# Patient Record
Sex: Female | Born: 1970 | Race: White | Hispanic: No | Marital: Married | Smoking: Never smoker
Health system: Southern US, Community
[De-identification: ages and names within clinical notes are randomized; demographics above are authoritative.]

---

## 2011-02-26 ENCOUNTER — Ambulatory Visit
Admission: RE | Admit: 2011-02-26 | Discharge: 2011-02-26 | Disposition: A | Discharge status: Home / Self Care | Payer: 59 | Source: Ambulatory Visit | Attending: Obstetrics and Gynecology | Admitting: Obstetrics and Gynecology

## 2011-02-26 ENCOUNTER — Other Ambulatory Visit: Payer: Self-pay | Admitting: Obstetrics and Gynecology

## 2011-02-26 DIAGNOSIS — R928 Other abnormal and inconclusive findings on diagnostic imaging of breast: Secondary | ICD-10-CM

## 2011-02-27 ENCOUNTER — Other Ambulatory Visit: Payer: Self-pay

## 2012-04-18 ENCOUNTER — Other Ambulatory Visit: Payer: Self-pay | Admitting: Obstetrics and Gynecology

## 2012-04-18 DIAGNOSIS — Z1231 Encounter for screening mammogram for malignant neoplasm of breast: Secondary | ICD-10-CM

## 2012-05-15 ENCOUNTER — Ambulatory Visit
Admission: RE | Admit: 2012-05-15 | Discharge: 2012-05-15 | Disposition: A | Discharge status: Home / Self Care | Payer: 59 | Source: Ambulatory Visit | Attending: Obstetrics and Gynecology | Admitting: Obstetrics and Gynecology

## 2012-05-15 DIAGNOSIS — Z1231 Encounter for screening mammogram for malignant neoplasm of breast: Secondary | ICD-10-CM

## 2012-05-16 ENCOUNTER — Other Ambulatory Visit: Payer: Self-pay | Admitting: Obstetrics and Gynecology

## 2012-05-16 DIAGNOSIS — R928 Other abnormal and inconclusive findings on diagnostic imaging of breast: Secondary | ICD-10-CM

## 2012-05-28 ENCOUNTER — Ambulatory Visit
Admission: RE | Admit: 2012-05-28 | Discharge: 2012-05-28 | Disposition: A | Discharge status: Home / Self Care | Payer: 59 | Source: Ambulatory Visit | Attending: Obstetrics and Gynecology | Admitting: Obstetrics and Gynecology

## 2012-05-28 DIAGNOSIS — R928 Other abnormal and inconclusive findings on diagnostic imaging of breast: Secondary | ICD-10-CM

## 2014-04-04 ENCOUNTER — Encounter (HOSPITAL_COMMUNITY): Payer: Self-pay

## 2014-04-04 ENCOUNTER — Emergency Department (HOSPITAL_COMMUNITY)
Admission: EM | Admit: 2014-04-04 | Discharge: 2014-04-04 | Disposition: A | Discharge status: Home / Self Care | Payer: 59 | Source: Home / Self Care | Attending: Family Medicine | Admitting: Family Medicine

## 2014-04-04 DIAGNOSIS — L738 Other specified follicular disorders: Secondary | ICD-10-CM

## 2014-04-04 DIAGNOSIS — L0102 Bockhart's impetigo: Secondary | ICD-10-CM

## 2014-04-04 MED ORDER — MINOCYCLINE HCL 100 MG PO CAPS: 100.0000 mg | ORAL_CAPSULE | Freq: Two times a day (BID) | ORAL | Status: AC

## 2014-04-04 NOTE — ED Notes (Signed)
Concerned about rash on scalp, ear, shoulder

## 2014-04-04 NOTE — ED Provider Notes (Signed)
CSN: [id number]     Arrival date & time 04/04/14  1046 History   First MD Initiated Contact with Patient 04/04/14 1120     Chief Complaint  Patient presents with  . Rash   (Consider location/radiation/quality/duration/timing/severity/associated sxs/prior Treatment) Patient is a 44 y.o. female presenting with rash. The history is provided by the patient.  Rash Location:  Head/neck Head/neck rash location:  Scalp Quality: draining, painful and redness   Pain details:    Severity:  Mild   Onset quality:  Gradual   Duration:  5 days   Progression:  Worsening Progression:  Spreading Chronicity:  New Context comment:  Uncertain etiol , pt exercises reg on equip, etc. Associated symptoms: no fever     History reviewed. No pertinent past medical history. History reviewed. No pertinent past surgical history. History reviewed. No pertinent family history. History  Substance Use Topics  . Smoking status: Never Smoker   . Smokeless tobacco: Not on file  . Alcohol Use: No   OB History    No data available     Review of Systems  Constitutional: Negative.  Negative for fever.  HENT: Negative.   Skin: Positive for rash.    Allergies  Levaquin and Penicillins  Home Medications   Prior to Admission medications   Medication Sig Start Date End Date Taking? Authorizing Provider  levonorgestrel-ethinyl estradiol (AVIANE,ALESSE,LESSINA) 0.1-20 MG-MCG tablet Take 1 tablet by mouth daily.   Yes Historical Provider, MD  minocycline (MINOCIN,DYNACIN) 100 MG capsule Take 1 capsule (100 mg total) by mouth 2 (two) times daily. 04/04/14   Linna HoffJames D Kindl, MD   BP 124/84 mmHg  Pulse 82  Temp(Src) 98.7 F (37.1 C) (Oral)  Resp 16  SpO2 100% Physical Exam  Constitutional: She is oriented to person, place, and time. She appears well-developed and well-nourished. She appears distressed.  HENT:  Right Ear: External ear normal.  Left Ear: External ear normal.  Mouth/Throat: Oropharynx is clear  and moist.  Eyes: Conjunctivae are normal. Pupils are equal, round, and reactive to light.  Neck: Normal range of motion. Neck supple.  Lymphadenopathy:    She has cervical adenopathy.  Neurological: She is alert and oriented to person, place, and time.  Skin: Skin is warm and dry.  Papulopustular weeping rash on scalp with nodes post.  Nursing note and vitals reviewed.   ED Course  Procedures (including critical care time) Labs Review Labs Reviewed - No data to display  Imaging Review No results found.   MDM   1. Pustular folliculitis       Linna HoffJames D Kindl, MD 04/04/14 [phone]

## 2014-04-04 NOTE — Discharge Instructions (Signed)
Wash scalp regularly, change brush. Take all of medicine.

## 2014-09-23 ENCOUNTER — Other Ambulatory Visit: Payer: Self-pay

## 2014-09-23 DIAGNOSIS — Z1231 Encounter for screening mammogram for malignant neoplasm of breast: Secondary | ICD-10-CM

## 2014-10-12 ENCOUNTER — Ambulatory Visit
Admission: RE | Admit: 2014-10-12 | Discharge: 2014-10-12 | Disposition: A | Discharge status: Home / Self Care | Payer: 59 | Source: Ambulatory Visit

## 2014-10-12 DIAGNOSIS — Z1231 Encounter for screening mammogram for malignant neoplasm of breast: Secondary | ICD-10-CM

## 2015-09-05 ENCOUNTER — Other Ambulatory Visit: Payer: Self-pay | Admitting: Obstetrics and Gynecology

## 2015-09-05 DIAGNOSIS — Z1231 Encounter for screening mammogram for malignant neoplasm of breast: Secondary | ICD-10-CM

## 2015-10-17 ENCOUNTER — Ambulatory Visit
Admission: RE | Admit: 2015-10-17 | Discharge: 2015-10-17 | Disposition: A | Discharge status: Home / Self Care | Payer: 59 | Source: Ambulatory Visit | Attending: Obstetrics and Gynecology | Admitting: Obstetrics and Gynecology

## 2015-10-17 DIAGNOSIS — Z1231 Encounter for screening mammogram for malignant neoplasm of breast: Secondary | ICD-10-CM

## 2016-02-09 ENCOUNTER — Other Ambulatory Visit: Payer: Self-pay | Admitting: Internal Medicine

## 2016-02-09 DIAGNOSIS — R339 Retention of urine, unspecified: Secondary | ICD-10-CM

## 2016-02-10 ENCOUNTER — Other Ambulatory Visit: Payer: 59

## 2016-02-17 ENCOUNTER — Other Ambulatory Visit: Payer: 59

## 2016-03-16 DIAGNOSIS — J3089 Other allergic rhinitis: Secondary | ICD-10-CM | POA: Diagnosis not present

## 2016-03-16 DIAGNOSIS — J301 Allergic rhinitis due to pollen: Secondary | ICD-10-CM | POA: Diagnosis not present

## 2016-03-16 DIAGNOSIS — J3081 Allergic rhinitis due to animal (cat) (dog) hair and dander: Secondary | ICD-10-CM | POA: Diagnosis not present

## 2016-03-22 DIAGNOSIS — J3089 Other allergic rhinitis: Secondary | ICD-10-CM | POA: Diagnosis not present

## 2016-03-22 DIAGNOSIS — J3081 Allergic rhinitis due to animal (cat) (dog) hair and dander: Secondary | ICD-10-CM | POA: Diagnosis not present

## 2016-03-22 DIAGNOSIS — J301 Allergic rhinitis due to pollen: Secondary | ICD-10-CM | POA: Diagnosis not present

## 2016-03-26 DIAGNOSIS — H1089 Other conjunctivitis: Secondary | ICD-10-CM | POA: Diagnosis not present

## 2016-03-26 DIAGNOSIS — H04123 Dry eye syndrome of bilateral lacrimal glands: Secondary | ICD-10-CM | POA: Diagnosis not present

## 2016-03-26 DIAGNOSIS — H02841 Edema of right upper eyelid: Secondary | ICD-10-CM | POA: Diagnosis not present

## 2016-03-30 DIAGNOSIS — J3089 Other allergic rhinitis: Secondary | ICD-10-CM | POA: Diagnosis not present

## 2016-03-30 DIAGNOSIS — J301 Allergic rhinitis due to pollen: Secondary | ICD-10-CM | POA: Diagnosis not present

## 2016-03-30 DIAGNOSIS — J3081 Allergic rhinitis due to animal (cat) (dog) hair and dander: Secondary | ICD-10-CM | POA: Diagnosis not present

## 2016-04-02 DIAGNOSIS — J301 Allergic rhinitis due to pollen: Secondary | ICD-10-CM | POA: Diagnosis not present

## 2016-04-03 DIAGNOSIS — J3089 Other allergic rhinitis: Secondary | ICD-10-CM | POA: Diagnosis not present

## 2016-04-03 DIAGNOSIS — J3081 Allergic rhinitis due to animal (cat) (dog) hair and dander: Secondary | ICD-10-CM | POA: Diagnosis not present

## 2016-04-12 DIAGNOSIS — J3089 Other allergic rhinitis: Secondary | ICD-10-CM | POA: Diagnosis not present

## 2016-04-12 DIAGNOSIS — J301 Allergic rhinitis due to pollen: Secondary | ICD-10-CM | POA: Diagnosis not present

## 2016-04-12 DIAGNOSIS — J3081 Allergic rhinitis due to animal (cat) (dog) hair and dander: Secondary | ICD-10-CM | POA: Diagnosis not present

## 2016-04-13 DIAGNOSIS — M75102 Unspecified rotator cuff tear or rupture of left shoulder, not specified as traumatic: Secondary | ICD-10-CM | POA: Diagnosis not present

## 2016-04-13 DIAGNOSIS — M542 Cervicalgia: Secondary | ICD-10-CM | POA: Diagnosis not present

## 2016-04-16 DIAGNOSIS — M7582 Other shoulder lesions, left shoulder: Secondary | ICD-10-CM | POA: Diagnosis not present

## 2016-04-16 DIAGNOSIS — M542 Cervicalgia: Secondary | ICD-10-CM | POA: Diagnosis not present

## 2016-04-19 DIAGNOSIS — M25512 Pain in left shoulder: Secondary | ICD-10-CM | POA: Diagnosis not present

## 2016-04-19 DIAGNOSIS — S46012D Strain of muscle(s) and tendon(s) of the rotator cuff of left shoulder, subsequent encounter: Secondary | ICD-10-CM | POA: Diagnosis not present

## 2016-04-19 DIAGNOSIS — M7502 Adhesive capsulitis of left shoulder: Secondary | ICD-10-CM | POA: Diagnosis not present

## 2016-04-24 DIAGNOSIS — M25512 Pain in left shoulder: Secondary | ICD-10-CM | POA: Diagnosis not present

## 2016-04-24 DIAGNOSIS — M7502 Adhesive capsulitis of left shoulder: Secondary | ICD-10-CM | POA: Diagnosis not present

## 2016-04-24 DIAGNOSIS — S46012D Strain of muscle(s) and tendon(s) of the rotator cuff of left shoulder, subsequent encounter: Secondary | ICD-10-CM | POA: Diagnosis not present

## 2016-04-26 DIAGNOSIS — M25512 Pain in left shoulder: Secondary | ICD-10-CM | POA: Diagnosis not present

## 2016-04-26 DIAGNOSIS — S46012D Strain of muscle(s) and tendon(s) of the rotator cuff of left shoulder, subsequent encounter: Secondary | ICD-10-CM | POA: Diagnosis not present

## 2016-04-26 DIAGNOSIS — M7502 Adhesive capsulitis of left shoulder: Secondary | ICD-10-CM | POA: Diagnosis not present

## 2016-04-27 DIAGNOSIS — J301 Allergic rhinitis due to pollen: Secondary | ICD-10-CM | POA: Diagnosis not present

## 2016-04-27 DIAGNOSIS — J3081 Allergic rhinitis due to animal (cat) (dog) hair and dander: Secondary | ICD-10-CM | POA: Diagnosis not present

## 2016-04-27 DIAGNOSIS — J3089 Other allergic rhinitis: Secondary | ICD-10-CM | POA: Diagnosis not present

## 2016-05-01 DIAGNOSIS — M7502 Adhesive capsulitis of left shoulder: Secondary | ICD-10-CM | POA: Diagnosis not present

## 2016-05-01 DIAGNOSIS — M25512 Pain in left shoulder: Secondary | ICD-10-CM | POA: Diagnosis not present

## 2016-05-01 DIAGNOSIS — S46012D Strain of muscle(s) and tendon(s) of the rotator cuff of left shoulder, subsequent encounter: Secondary | ICD-10-CM | POA: Diagnosis not present

## 2016-05-03 DIAGNOSIS — J3089 Other allergic rhinitis: Secondary | ICD-10-CM | POA: Diagnosis not present

## 2016-05-03 DIAGNOSIS — J3081 Allergic rhinitis due to animal (cat) (dog) hair and dander: Secondary | ICD-10-CM | POA: Diagnosis not present

## 2016-05-03 DIAGNOSIS — J301 Allergic rhinitis due to pollen: Secondary | ICD-10-CM | POA: Diagnosis not present

## 2016-05-04 DIAGNOSIS — M7502 Adhesive capsulitis of left shoulder: Secondary | ICD-10-CM | POA: Diagnosis not present

## 2016-05-04 DIAGNOSIS — S46012D Strain of muscle(s) and tendon(s) of the rotator cuff of left shoulder, subsequent encounter: Secondary | ICD-10-CM | POA: Diagnosis not present

## 2016-05-04 DIAGNOSIS — M25512 Pain in left shoulder: Secondary | ICD-10-CM | POA: Diagnosis not present

## 2016-05-09 DIAGNOSIS — M7502 Adhesive capsulitis of left shoulder: Secondary | ICD-10-CM | POA: Diagnosis not present

## 2016-05-09 DIAGNOSIS — M25512 Pain in left shoulder: Secondary | ICD-10-CM | POA: Diagnosis not present

## 2016-05-09 DIAGNOSIS — S46012D Strain of muscle(s) and tendon(s) of the rotator cuff of left shoulder, subsequent encounter: Secondary | ICD-10-CM | POA: Diagnosis not present

## 2016-05-11 DIAGNOSIS — S46012D Strain of muscle(s) and tendon(s) of the rotator cuff of left shoulder, subsequent encounter: Secondary | ICD-10-CM | POA: Diagnosis not present

## 2016-05-11 DIAGNOSIS — M25512 Pain in left shoulder: Secondary | ICD-10-CM | POA: Diagnosis not present

## 2016-05-11 DIAGNOSIS — M7502 Adhesive capsulitis of left shoulder: Secondary | ICD-10-CM | POA: Diagnosis not present

## 2016-05-14 DIAGNOSIS — J3081 Allergic rhinitis due to animal (cat) (dog) hair and dander: Secondary | ICD-10-CM | POA: Diagnosis not present

## 2016-05-14 DIAGNOSIS — J301 Allergic rhinitis due to pollen: Secondary | ICD-10-CM | POA: Diagnosis not present

## 2016-05-14 DIAGNOSIS — J3089 Other allergic rhinitis: Secondary | ICD-10-CM | POA: Diagnosis not present

## 2016-05-15 DIAGNOSIS — S46012D Strain of muscle(s) and tendon(s) of the rotator cuff of left shoulder, subsequent encounter: Secondary | ICD-10-CM | POA: Diagnosis not present

## 2016-05-15 DIAGNOSIS — M7502 Adhesive capsulitis of left shoulder: Secondary | ICD-10-CM | POA: Diagnosis not present

## 2016-05-15 DIAGNOSIS — M25512 Pain in left shoulder: Secondary | ICD-10-CM | POA: Diagnosis not present

## 2016-05-17 DIAGNOSIS — S46012D Strain of muscle(s) and tendon(s) of the rotator cuff of left shoulder, subsequent encounter: Secondary | ICD-10-CM | POA: Diagnosis not present

## 2016-05-17 DIAGNOSIS — M25512 Pain in left shoulder: Secondary | ICD-10-CM | POA: Diagnosis not present

## 2016-05-17 DIAGNOSIS — M7502 Adhesive capsulitis of left shoulder: Secondary | ICD-10-CM | POA: Diagnosis not present

## 2016-05-21 DIAGNOSIS — J3081 Allergic rhinitis due to animal (cat) (dog) hair and dander: Secondary | ICD-10-CM | POA: Diagnosis not present

## 2016-05-21 DIAGNOSIS — J3089 Other allergic rhinitis: Secondary | ICD-10-CM | POA: Diagnosis not present

## 2016-05-21 DIAGNOSIS — J301 Allergic rhinitis due to pollen: Secondary | ICD-10-CM | POA: Diagnosis not present

## 2016-05-22 DIAGNOSIS — S46012D Strain of muscle(s) and tendon(s) of the rotator cuff of left shoulder, subsequent encounter: Secondary | ICD-10-CM | POA: Diagnosis not present

## 2016-05-22 DIAGNOSIS — M7502 Adhesive capsulitis of left shoulder: Secondary | ICD-10-CM | POA: Diagnosis not present

## 2016-05-22 DIAGNOSIS — M25512 Pain in left shoulder: Secondary | ICD-10-CM | POA: Diagnosis not present

## 2016-05-25 DIAGNOSIS — S46012D Strain of muscle(s) and tendon(s) of the rotator cuff of left shoulder, subsequent encounter: Secondary | ICD-10-CM | POA: Diagnosis not present

## 2016-05-25 DIAGNOSIS — M25512 Pain in left shoulder: Secondary | ICD-10-CM | POA: Diagnosis not present

## 2016-05-25 DIAGNOSIS — M7502 Adhesive capsulitis of left shoulder: Secondary | ICD-10-CM | POA: Diagnosis not present

## 2016-05-29 DIAGNOSIS — M25512 Pain in left shoulder: Secondary | ICD-10-CM | POA: Diagnosis not present

## 2016-05-29 DIAGNOSIS — S46012D Strain of muscle(s) and tendon(s) of the rotator cuff of left shoulder, subsequent encounter: Secondary | ICD-10-CM | POA: Diagnosis not present

## 2016-05-29 DIAGNOSIS — M7502 Adhesive capsulitis of left shoulder: Secondary | ICD-10-CM | POA: Diagnosis not present

## 2016-05-31 DIAGNOSIS — J3089 Other allergic rhinitis: Secondary | ICD-10-CM | POA: Diagnosis not present

## 2016-05-31 DIAGNOSIS — J3081 Allergic rhinitis due to animal (cat) (dog) hair and dander: Secondary | ICD-10-CM | POA: Diagnosis not present

## 2016-05-31 DIAGNOSIS — J301 Allergic rhinitis due to pollen: Secondary | ICD-10-CM | POA: Diagnosis not present

## 2016-06-01 DIAGNOSIS — S46012D Strain of muscle(s) and tendon(s) of the rotator cuff of left shoulder, subsequent encounter: Secondary | ICD-10-CM | POA: Diagnosis not present

## 2016-06-01 DIAGNOSIS — M7502 Adhesive capsulitis of left shoulder: Secondary | ICD-10-CM | POA: Diagnosis not present

## 2016-06-01 DIAGNOSIS — M25512 Pain in left shoulder: Secondary | ICD-10-CM | POA: Diagnosis not present

## 2016-06-06 DIAGNOSIS — M25512 Pain in left shoulder: Secondary | ICD-10-CM | POA: Diagnosis not present

## 2016-06-06 DIAGNOSIS — S46012D Strain of muscle(s) and tendon(s) of the rotator cuff of left shoulder, subsequent encounter: Secondary | ICD-10-CM | POA: Diagnosis not present

## 2016-06-06 DIAGNOSIS — M7502 Adhesive capsulitis of left shoulder: Secondary | ICD-10-CM | POA: Diagnosis not present

## 2016-06-08 DIAGNOSIS — S46012D Strain of muscle(s) and tendon(s) of the rotator cuff of left shoulder, subsequent encounter: Secondary | ICD-10-CM | POA: Diagnosis not present

## 2016-06-08 DIAGNOSIS — M25512 Pain in left shoulder: Secondary | ICD-10-CM | POA: Diagnosis not present

## 2016-06-08 DIAGNOSIS — M7502 Adhesive capsulitis of left shoulder: Secondary | ICD-10-CM | POA: Diagnosis not present

## 2016-06-14 DIAGNOSIS — J3081 Allergic rhinitis due to animal (cat) (dog) hair and dander: Secondary | ICD-10-CM | POA: Diagnosis not present

## 2016-06-14 DIAGNOSIS — J301 Allergic rhinitis due to pollen: Secondary | ICD-10-CM | POA: Diagnosis not present

## 2016-06-14 DIAGNOSIS — J3089 Other allergic rhinitis: Secondary | ICD-10-CM | POA: Diagnosis not present

## 2016-06-29 DIAGNOSIS — J3089 Other allergic rhinitis: Secondary | ICD-10-CM | POA: Diagnosis not present

## 2016-06-29 DIAGNOSIS — J301 Allergic rhinitis due to pollen: Secondary | ICD-10-CM | POA: Diagnosis not present

## 2016-06-29 DIAGNOSIS — J3081 Allergic rhinitis due to animal (cat) (dog) hair and dander: Secondary | ICD-10-CM | POA: Diagnosis not present

## 2016-07-06 DIAGNOSIS — J301 Allergic rhinitis due to pollen: Secondary | ICD-10-CM | POA: Diagnosis not present

## 2016-07-06 DIAGNOSIS — J3089 Other allergic rhinitis: Secondary | ICD-10-CM | POA: Diagnosis not present

## 2016-07-06 DIAGNOSIS — J3081 Allergic rhinitis due to animal (cat) (dog) hair and dander: Secondary | ICD-10-CM | POA: Diagnosis not present

## 2016-07-13 DIAGNOSIS — J301 Allergic rhinitis due to pollen: Secondary | ICD-10-CM | POA: Diagnosis not present

## 2016-07-13 DIAGNOSIS — J3089 Other allergic rhinitis: Secondary | ICD-10-CM | POA: Diagnosis not present

## 2016-07-13 DIAGNOSIS — J3081 Allergic rhinitis due to animal (cat) (dog) hair and dander: Secondary | ICD-10-CM | POA: Diagnosis not present

## 2016-07-18 DIAGNOSIS — M25562 Pain in left knee: Secondary | ICD-10-CM | POA: Diagnosis not present

## 2016-07-26 DIAGNOSIS — J3089 Other allergic rhinitis: Secondary | ICD-10-CM | POA: Diagnosis not present

## 2016-07-26 DIAGNOSIS — J3081 Allergic rhinitis due to animal (cat) (dog) hair and dander: Secondary | ICD-10-CM | POA: Diagnosis not present

## 2016-07-26 DIAGNOSIS — J301 Allergic rhinitis due to pollen: Secondary | ICD-10-CM | POA: Diagnosis not present

## 2016-08-02 DIAGNOSIS — J3089 Other allergic rhinitis: Secondary | ICD-10-CM | POA: Diagnosis not present

## 2016-08-02 DIAGNOSIS — J3081 Allergic rhinitis due to animal (cat) (dog) hair and dander: Secondary | ICD-10-CM | POA: Diagnosis not present

## 2016-08-02 DIAGNOSIS — J301 Allergic rhinitis due to pollen: Secondary | ICD-10-CM | POA: Diagnosis not present

## 2016-08-15 ENCOUNTER — Other Ambulatory Visit: Payer: Self-pay | Admitting: Internal Medicine

## 2016-08-15 DIAGNOSIS — M79605 Pain in left leg: Secondary | ICD-10-CM | POA: Diagnosis not present

## 2016-08-15 DIAGNOSIS — Z0000 Encounter for general adult medical examination without abnormal findings: Secondary | ICD-10-CM | POA: Diagnosis not present

## 2016-08-16 ENCOUNTER — Ambulatory Visit
Admission: RE | Admit: 2016-08-16 | Discharge: 2016-08-16 | Disposition: A | Discharge status: Home / Self Care | Payer: 59 | Source: Ambulatory Visit | Attending: Internal Medicine | Admitting: Internal Medicine

## 2016-08-16 DIAGNOSIS — M79605 Pain in left leg: Secondary | ICD-10-CM | POA: Diagnosis not present

## 2016-08-17 DIAGNOSIS — J301 Allergic rhinitis due to pollen: Secondary | ICD-10-CM | POA: Diagnosis not present

## 2016-08-17 DIAGNOSIS — J3089 Other allergic rhinitis: Secondary | ICD-10-CM | POA: Diagnosis not present

## 2016-08-17 DIAGNOSIS — J3081 Allergic rhinitis due to animal (cat) (dog) hair and dander: Secondary | ICD-10-CM | POA: Diagnosis not present

## 2016-08-30 DIAGNOSIS — J3081 Allergic rhinitis due to animal (cat) (dog) hair and dander: Secondary | ICD-10-CM | POA: Diagnosis not present

## 2016-08-30 DIAGNOSIS — J301 Allergic rhinitis due to pollen: Secondary | ICD-10-CM | POA: Diagnosis not present

## 2016-08-30 DIAGNOSIS — J3089 Other allergic rhinitis: Secondary | ICD-10-CM | POA: Diagnosis not present

## 2016-08-31 DIAGNOSIS — M25562 Pain in left knee: Secondary | ICD-10-CM | POA: Diagnosis not present

## 2016-08-31 DIAGNOSIS — Z0000 Encounter for general adult medical examination without abnormal findings: Secondary | ICD-10-CM | POA: Diagnosis not present

## 2016-09-07 DIAGNOSIS — J301 Allergic rhinitis due to pollen: Secondary | ICD-10-CM | POA: Diagnosis not present

## 2016-09-07 DIAGNOSIS — J3089 Other allergic rhinitis: Secondary | ICD-10-CM | POA: Diagnosis not present

## 2016-09-07 DIAGNOSIS — J3081 Allergic rhinitis due to animal (cat) (dog) hair and dander: Secondary | ICD-10-CM | POA: Diagnosis not present

## 2016-09-11 ENCOUNTER — Other Ambulatory Visit: Payer: Self-pay | Admitting: Obstetrics and Gynecology

## 2016-09-11 DIAGNOSIS — Z1231 Encounter for screening mammogram for malignant neoplasm of breast: Secondary | ICD-10-CM

## 2016-09-13 DIAGNOSIS — J301 Allergic rhinitis due to pollen: Secondary | ICD-10-CM | POA: Diagnosis not present

## 2016-09-13 DIAGNOSIS — J3089 Other allergic rhinitis: Secondary | ICD-10-CM | POA: Diagnosis not present

## 2016-09-13 DIAGNOSIS — Z124 Encounter for screening for malignant neoplasm of cervix: Secondary | ICD-10-CM | POA: Diagnosis not present

## 2016-09-13 DIAGNOSIS — Z01419 Encounter for gynecological examination (general) (routine) without abnormal findings: Secondary | ICD-10-CM | POA: Diagnosis not present

## 2016-09-13 DIAGNOSIS — J3081 Allergic rhinitis due to animal (cat) (dog) hair and dander: Secondary | ICD-10-CM | POA: Diagnosis not present

## 2016-09-21 DIAGNOSIS — J301 Allergic rhinitis due to pollen: Secondary | ICD-10-CM | POA: Diagnosis not present

## 2016-09-21 DIAGNOSIS — J3089 Other allergic rhinitis: Secondary | ICD-10-CM | POA: Diagnosis not present

## 2016-09-21 DIAGNOSIS — J3081 Allergic rhinitis due to animal (cat) (dog) hair and dander: Secondary | ICD-10-CM | POA: Diagnosis not present

## 2016-09-28 DIAGNOSIS — J301 Allergic rhinitis due to pollen: Secondary | ICD-10-CM | POA: Diagnosis not present

## 2016-09-28 DIAGNOSIS — J3089 Other allergic rhinitis: Secondary | ICD-10-CM | POA: Diagnosis not present

## 2016-09-28 DIAGNOSIS — J3081 Allergic rhinitis due to animal (cat) (dog) hair and dander: Secondary | ICD-10-CM | POA: Diagnosis not present

## 2016-10-04 DIAGNOSIS — J301 Allergic rhinitis due to pollen: Secondary | ICD-10-CM | POA: Diagnosis not present

## 2016-10-04 DIAGNOSIS — J3081 Allergic rhinitis due to animal (cat) (dog) hair and dander: Secondary | ICD-10-CM | POA: Diagnosis not present

## 2016-10-04 DIAGNOSIS — J3089 Other allergic rhinitis: Secondary | ICD-10-CM | POA: Diagnosis not present

## 2016-10-11 DIAGNOSIS — J301 Allergic rhinitis due to pollen: Secondary | ICD-10-CM | POA: Diagnosis not present

## 2016-10-11 DIAGNOSIS — J3089 Other allergic rhinitis: Secondary | ICD-10-CM | POA: Diagnosis not present

## 2016-10-11 DIAGNOSIS — J3081 Allergic rhinitis due to animal (cat) (dog) hair and dander: Secondary | ICD-10-CM | POA: Diagnosis not present

## 2016-10-19 DIAGNOSIS — J301 Allergic rhinitis due to pollen: Secondary | ICD-10-CM | POA: Diagnosis not present

## 2016-10-19 DIAGNOSIS — J3081 Allergic rhinitis due to animal (cat) (dog) hair and dander: Secondary | ICD-10-CM | POA: Diagnosis not present

## 2016-10-19 DIAGNOSIS — J3089 Other allergic rhinitis: Secondary | ICD-10-CM | POA: Diagnosis not present

## 2016-10-22 ENCOUNTER — Ambulatory Visit
Admission: RE | Admit: 2016-10-22 | Discharge: 2016-10-22 | Disposition: A | Discharge status: Home / Self Care | Payer: 59 | Source: Ambulatory Visit | Attending: Obstetrics and Gynecology | Admitting: Obstetrics and Gynecology

## 2016-10-22 DIAGNOSIS — Z1231 Encounter for screening mammogram for malignant neoplasm of breast: Secondary | ICD-10-CM | POA: Diagnosis not present

## 2016-11-05 DIAGNOSIS — J301 Allergic rhinitis due to pollen: Secondary | ICD-10-CM | POA: Diagnosis not present

## 2016-11-05 DIAGNOSIS — J3081 Allergic rhinitis due to animal (cat) (dog) hair and dander: Secondary | ICD-10-CM | POA: Diagnosis not present

## 2016-11-05 DIAGNOSIS — J3089 Other allergic rhinitis: Secondary | ICD-10-CM | POA: Diagnosis not present

## 2016-11-16 DIAGNOSIS — J301 Allergic rhinitis due to pollen: Secondary | ICD-10-CM | POA: Diagnosis not present

## 2016-11-16 DIAGNOSIS — J3089 Other allergic rhinitis: Secondary | ICD-10-CM | POA: Diagnosis not present

## 2016-11-16 DIAGNOSIS — J3081 Allergic rhinitis due to animal (cat) (dog) hair and dander: Secondary | ICD-10-CM | POA: Diagnosis not present

## 2016-11-23 DIAGNOSIS — J301 Allergic rhinitis due to pollen: Secondary | ICD-10-CM | POA: Diagnosis not present

## 2016-11-23 DIAGNOSIS — J3089 Other allergic rhinitis: Secondary | ICD-10-CM | POA: Diagnosis not present

## 2016-11-23 DIAGNOSIS — J3081 Allergic rhinitis due to animal (cat) (dog) hair and dander: Secondary | ICD-10-CM | POA: Diagnosis not present

## 2016-12-14 DIAGNOSIS — J301 Allergic rhinitis due to pollen: Secondary | ICD-10-CM | POA: Diagnosis not present

## 2016-12-14 DIAGNOSIS — J3089 Other allergic rhinitis: Secondary | ICD-10-CM | POA: Diagnosis not present

## 2016-12-14 DIAGNOSIS — J3081 Allergic rhinitis due to animal (cat) (dog) hair and dander: Secondary | ICD-10-CM | POA: Diagnosis not present

## 2016-12-27 DIAGNOSIS — H2513 Age-related nuclear cataract, bilateral: Secondary | ICD-10-CM | POA: Diagnosis not present

## 2016-12-27 DIAGNOSIS — H25013 Cortical age-related cataract, bilateral: Secondary | ICD-10-CM | POA: Diagnosis not present

## 2016-12-27 DIAGNOSIS — H35372 Puckering of macula, left eye: Secondary | ICD-10-CM | POA: Diagnosis not present

## 2016-12-27 DIAGNOSIS — H04123 Dry eye syndrome of bilateral lacrimal glands: Secondary | ICD-10-CM | POA: Diagnosis not present

## 2016-12-27 DIAGNOSIS — H35363 Drusen (degenerative) of macula, bilateral: Secondary | ICD-10-CM | POA: Diagnosis not present

## 2016-12-28 DIAGNOSIS — J3081 Allergic rhinitis due to animal (cat) (dog) hair and dander: Secondary | ICD-10-CM | POA: Diagnosis not present

## 2016-12-28 DIAGNOSIS — J301 Allergic rhinitis due to pollen: Secondary | ICD-10-CM | POA: Diagnosis not present

## 2016-12-28 DIAGNOSIS — J3089 Other allergic rhinitis: Secondary | ICD-10-CM | POA: Diagnosis not present

## 2017-01-02 DIAGNOSIS — J3081 Allergic rhinitis due to animal (cat) (dog) hair and dander: Secondary | ICD-10-CM | POA: Diagnosis not present

## 2017-01-02 DIAGNOSIS — J301 Allergic rhinitis due to pollen: Secondary | ICD-10-CM | POA: Diagnosis not present

## 2017-01-02 DIAGNOSIS — J3089 Other allergic rhinitis: Secondary | ICD-10-CM | POA: Diagnosis not present

## 2017-01-04 DIAGNOSIS — J301 Allergic rhinitis due to pollen: Secondary | ICD-10-CM | POA: Diagnosis not present

## 2017-01-04 DIAGNOSIS — J3089 Other allergic rhinitis: Secondary | ICD-10-CM | POA: Diagnosis not present

## 2017-01-04 DIAGNOSIS — J3081 Allergic rhinitis due to animal (cat) (dog) hair and dander: Secondary | ICD-10-CM | POA: Diagnosis not present

## 2017-01-18 DIAGNOSIS — J3081 Allergic rhinitis due to animal (cat) (dog) hair and dander: Secondary | ICD-10-CM | POA: Diagnosis not present

## 2017-01-18 DIAGNOSIS — J3089 Other allergic rhinitis: Secondary | ICD-10-CM | POA: Diagnosis not present

## 2017-01-18 DIAGNOSIS — J301 Allergic rhinitis due to pollen: Secondary | ICD-10-CM | POA: Diagnosis not present

## 2017-01-21 DIAGNOSIS — J301 Allergic rhinitis due to pollen: Secondary | ICD-10-CM | POA: Diagnosis not present

## 2017-01-22 DIAGNOSIS — J3089 Other allergic rhinitis: Secondary | ICD-10-CM | POA: Diagnosis not present

## 2017-01-22 DIAGNOSIS — J3081 Allergic rhinitis due to animal (cat) (dog) hair and dander: Secondary | ICD-10-CM | POA: Diagnosis not present

## 2017-02-08 DIAGNOSIS — J3089 Other allergic rhinitis: Secondary | ICD-10-CM | POA: Diagnosis not present

## 2017-02-08 DIAGNOSIS — J3081 Allergic rhinitis due to animal (cat) (dog) hair and dander: Secondary | ICD-10-CM | POA: Diagnosis not present

## 2017-02-08 DIAGNOSIS — J301 Allergic rhinitis due to pollen: Secondary | ICD-10-CM | POA: Diagnosis not present

## 2017-02-15 DIAGNOSIS — J3081 Allergic rhinitis due to animal (cat) (dog) hair and dander: Secondary | ICD-10-CM | POA: Diagnosis not present

## 2017-02-15 DIAGNOSIS — J3089 Other allergic rhinitis: Secondary | ICD-10-CM | POA: Diagnosis not present

## 2017-02-15 DIAGNOSIS — J301 Allergic rhinitis due to pollen: Secondary | ICD-10-CM | POA: Diagnosis not present

## 2017-02-22 DIAGNOSIS — J3081 Allergic rhinitis due to animal (cat) (dog) hair and dander: Secondary | ICD-10-CM | POA: Diagnosis not present

## 2017-02-22 DIAGNOSIS — J3089 Other allergic rhinitis: Secondary | ICD-10-CM | POA: Diagnosis not present

## 2017-02-22 DIAGNOSIS — J301 Allergic rhinitis due to pollen: Secondary | ICD-10-CM | POA: Diagnosis not present

## 2017-03-01 DIAGNOSIS — J3081 Allergic rhinitis due to animal (cat) (dog) hair and dander: Secondary | ICD-10-CM | POA: Diagnosis not present

## 2017-03-01 DIAGNOSIS — J301 Allergic rhinitis due to pollen: Secondary | ICD-10-CM | POA: Diagnosis not present

## 2017-03-01 DIAGNOSIS — J3089 Other allergic rhinitis: Secondary | ICD-10-CM | POA: Diagnosis not present

## 2017-03-14 DIAGNOSIS — J301 Allergic rhinitis due to pollen: Secondary | ICD-10-CM | POA: Diagnosis not present

## 2017-03-14 DIAGNOSIS — J3081 Allergic rhinitis due to animal (cat) (dog) hair and dander: Secondary | ICD-10-CM | POA: Diagnosis not present

## 2017-03-14 DIAGNOSIS — J3089 Other allergic rhinitis: Secondary | ICD-10-CM | POA: Diagnosis not present

## 2017-03-22 DIAGNOSIS — J3089 Other allergic rhinitis: Secondary | ICD-10-CM | POA: Diagnosis not present

## 2017-03-22 DIAGNOSIS — J3081 Allergic rhinitis due to animal (cat) (dog) hair and dander: Secondary | ICD-10-CM | POA: Diagnosis not present

## 2017-03-22 DIAGNOSIS — J301 Allergic rhinitis due to pollen: Secondary | ICD-10-CM | POA: Diagnosis not present

## 2017-03-28 DIAGNOSIS — J3089 Other allergic rhinitis: Secondary | ICD-10-CM | POA: Diagnosis not present

## 2017-03-28 DIAGNOSIS — J301 Allergic rhinitis due to pollen: Secondary | ICD-10-CM | POA: Diagnosis not present

## 2017-03-28 DIAGNOSIS — J3081 Allergic rhinitis due to animal (cat) (dog) hair and dander: Secondary | ICD-10-CM | POA: Diagnosis not present

## 2017-04-03 DIAGNOSIS — J3081 Allergic rhinitis due to animal (cat) (dog) hair and dander: Secondary | ICD-10-CM | POA: Diagnosis not present

## 2017-04-03 DIAGNOSIS — J301 Allergic rhinitis due to pollen: Secondary | ICD-10-CM | POA: Diagnosis not present

## 2017-04-03 DIAGNOSIS — J3089 Other allergic rhinitis: Secondary | ICD-10-CM | POA: Diagnosis not present

## 2017-04-10 DIAGNOSIS — J3081 Allergic rhinitis due to animal (cat) (dog) hair and dander: Secondary | ICD-10-CM | POA: Diagnosis not present

## 2017-04-10 DIAGNOSIS — J301 Allergic rhinitis due to pollen: Secondary | ICD-10-CM | POA: Diagnosis not present

## 2017-04-10 DIAGNOSIS — J3089 Other allergic rhinitis: Secondary | ICD-10-CM | POA: Diagnosis not present

## 2017-04-12 DIAGNOSIS — Z0000 Encounter for general adult medical examination without abnormal findings: Secondary | ICD-10-CM | POA: Diagnosis not present

## 2017-04-18 DIAGNOSIS — J3081 Allergic rhinitis due to animal (cat) (dog) hair and dander: Secondary | ICD-10-CM | POA: Diagnosis not present

## 2017-04-18 DIAGNOSIS — J3089 Other allergic rhinitis: Secondary | ICD-10-CM | POA: Diagnosis not present

## 2017-04-18 DIAGNOSIS — J301 Allergic rhinitis due to pollen: Secondary | ICD-10-CM | POA: Diagnosis not present

## 2017-04-19 DIAGNOSIS — Z0000 Encounter for general adult medical examination without abnormal findings: Secondary | ICD-10-CM | POA: Diagnosis not present

## 2017-04-25 DIAGNOSIS — R1909 Other intra-abdominal and pelvic swelling, mass and lump: Secondary | ICD-10-CM | POA: Diagnosis not present

## 2017-05-01 DIAGNOSIS — J3089 Other allergic rhinitis: Secondary | ICD-10-CM | POA: Diagnosis not present

## 2017-05-01 DIAGNOSIS — J301 Allergic rhinitis due to pollen: Secondary | ICD-10-CM | POA: Diagnosis not present

## 2017-05-01 DIAGNOSIS — J3081 Allergic rhinitis due to animal (cat) (dog) hair and dander: Secondary | ICD-10-CM | POA: Diagnosis not present

## 2017-05-10 DIAGNOSIS — J301 Allergic rhinitis due to pollen: Secondary | ICD-10-CM | POA: Diagnosis not present

## 2017-05-10 DIAGNOSIS — J3089 Other allergic rhinitis: Secondary | ICD-10-CM | POA: Diagnosis not present

## 2017-05-10 DIAGNOSIS — J3081 Allergic rhinitis due to animal (cat) (dog) hair and dander: Secondary | ICD-10-CM | POA: Diagnosis not present

## 2017-05-23 DIAGNOSIS — J3081 Allergic rhinitis due to animal (cat) (dog) hair and dander: Secondary | ICD-10-CM | POA: Diagnosis not present

## 2017-05-23 DIAGNOSIS — J3089 Other allergic rhinitis: Secondary | ICD-10-CM | POA: Diagnosis not present

## 2017-05-23 DIAGNOSIS — J301 Allergic rhinitis due to pollen: Secondary | ICD-10-CM | POA: Diagnosis not present

## 2017-06-13 DIAGNOSIS — J3081 Allergic rhinitis due to animal (cat) (dog) hair and dander: Secondary | ICD-10-CM | POA: Diagnosis not present

## 2017-06-13 DIAGNOSIS — J3089 Other allergic rhinitis: Secondary | ICD-10-CM | POA: Diagnosis not present

## 2017-06-13 DIAGNOSIS — J301 Allergic rhinitis due to pollen: Secondary | ICD-10-CM | POA: Diagnosis not present

## 2017-06-17 DIAGNOSIS — Z9189 Other specified personal risk factors, not elsewhere classified: Secondary | ICD-10-CM | POA: Diagnosis not present

## 2017-06-17 DIAGNOSIS — N760 Acute vaginitis: Secondary | ICD-10-CM | POA: Diagnosis not present

## 2017-07-02 DIAGNOSIS — J019 Acute sinusitis, unspecified: Secondary | ICD-10-CM | POA: Diagnosis not present

## 2017-07-02 DIAGNOSIS — J3081 Allergic rhinitis due to animal (cat) (dog) hair and dander: Secondary | ICD-10-CM | POA: Diagnosis not present

## 2017-07-02 DIAGNOSIS — J301 Allergic rhinitis due to pollen: Secondary | ICD-10-CM | POA: Diagnosis not present

## 2017-07-02 DIAGNOSIS — J3089 Other allergic rhinitis: Secondary | ICD-10-CM | POA: Diagnosis not present

## 2017-07-04 DIAGNOSIS — J301 Allergic rhinitis due to pollen: Secondary | ICD-10-CM | POA: Diagnosis not present

## 2017-07-04 DIAGNOSIS — J3081 Allergic rhinitis due to animal (cat) (dog) hair and dander: Secondary | ICD-10-CM | POA: Diagnosis not present

## 2017-07-04 DIAGNOSIS — J3089 Other allergic rhinitis: Secondary | ICD-10-CM | POA: Diagnosis not present

## 2017-07-18 DIAGNOSIS — J3089 Other allergic rhinitis: Secondary | ICD-10-CM | POA: Diagnosis not present

## 2017-07-18 DIAGNOSIS — J301 Allergic rhinitis due to pollen: Secondary | ICD-10-CM | POA: Diagnosis not present

## 2017-07-18 DIAGNOSIS — J3081 Allergic rhinitis due to animal (cat) (dog) hair and dander: Secondary | ICD-10-CM | POA: Diagnosis not present

## 2017-07-26 DIAGNOSIS — J301 Allergic rhinitis due to pollen: Secondary | ICD-10-CM | POA: Diagnosis not present

## 2017-07-26 DIAGNOSIS — J3081 Allergic rhinitis due to animal (cat) (dog) hair and dander: Secondary | ICD-10-CM | POA: Diagnosis not present

## 2017-07-26 DIAGNOSIS — J3089 Other allergic rhinitis: Secondary | ICD-10-CM | POA: Diagnosis not present

## 2017-08-02 DIAGNOSIS — J301 Allergic rhinitis due to pollen: Secondary | ICD-10-CM | POA: Diagnosis not present

## 2017-08-02 DIAGNOSIS — J3089 Other allergic rhinitis: Secondary | ICD-10-CM | POA: Diagnosis not present

## 2017-08-02 DIAGNOSIS — J3081 Allergic rhinitis due to animal (cat) (dog) hair and dander: Secondary | ICD-10-CM | POA: Diagnosis not present

## 2017-08-07 DIAGNOSIS — J301 Allergic rhinitis due to pollen: Secondary | ICD-10-CM | POA: Diagnosis not present

## 2017-08-07 DIAGNOSIS — J3089 Other allergic rhinitis: Secondary | ICD-10-CM | POA: Diagnosis not present

## 2017-08-07 DIAGNOSIS — J3081 Allergic rhinitis due to animal (cat) (dog) hair and dander: Secondary | ICD-10-CM | POA: Diagnosis not present

## 2017-08-16 DIAGNOSIS — J3089 Other allergic rhinitis: Secondary | ICD-10-CM | POA: Diagnosis not present

## 2017-08-16 DIAGNOSIS — J3081 Allergic rhinitis due to animal (cat) (dog) hair and dander: Secondary | ICD-10-CM | POA: Diagnosis not present

## 2017-08-16 DIAGNOSIS — J301 Allergic rhinitis due to pollen: Secondary | ICD-10-CM | POA: Diagnosis not present

## 2017-08-22 DIAGNOSIS — J301 Allergic rhinitis due to pollen: Secondary | ICD-10-CM | POA: Diagnosis not present

## 2017-08-22 DIAGNOSIS — J3081 Allergic rhinitis due to animal (cat) (dog) hair and dander: Secondary | ICD-10-CM | POA: Diagnosis not present

## 2017-08-22 DIAGNOSIS — J3089 Other allergic rhinitis: Secondary | ICD-10-CM | POA: Diagnosis not present

## 2017-08-26 DIAGNOSIS — J3089 Other allergic rhinitis: Secondary | ICD-10-CM | POA: Diagnosis not present

## 2017-08-26 DIAGNOSIS — J3081 Allergic rhinitis due to animal (cat) (dog) hair and dander: Secondary | ICD-10-CM | POA: Diagnosis not present

## 2017-08-26 DIAGNOSIS — J301 Allergic rhinitis due to pollen: Secondary | ICD-10-CM | POA: Diagnosis not present

## 2017-08-30 DIAGNOSIS — J3089 Other allergic rhinitis: Secondary | ICD-10-CM | POA: Diagnosis not present

## 2017-08-30 DIAGNOSIS — J3081 Allergic rhinitis due to animal (cat) (dog) hair and dander: Secondary | ICD-10-CM | POA: Diagnosis not present

## 2017-08-30 DIAGNOSIS — J301 Allergic rhinitis due to pollen: Secondary | ICD-10-CM | POA: Diagnosis not present

## 2017-09-06 DIAGNOSIS — J3089 Other allergic rhinitis: Secondary | ICD-10-CM | POA: Diagnosis not present

## 2017-09-06 DIAGNOSIS — J3081 Allergic rhinitis due to animal (cat) (dog) hair and dander: Secondary | ICD-10-CM | POA: Diagnosis not present

## 2017-09-06 DIAGNOSIS — J301 Allergic rhinitis due to pollen: Secondary | ICD-10-CM | POA: Diagnosis not present

## 2017-09-20 DIAGNOSIS — J3089 Other allergic rhinitis: Secondary | ICD-10-CM | POA: Diagnosis not present

## 2017-09-20 DIAGNOSIS — J3081 Allergic rhinitis due to animal (cat) (dog) hair and dander: Secondary | ICD-10-CM | POA: Diagnosis not present

## 2017-09-20 DIAGNOSIS — J301 Allergic rhinitis due to pollen: Secondary | ICD-10-CM | POA: Diagnosis not present

## 2017-09-27 ENCOUNTER — Other Ambulatory Visit: Payer: Self-pay | Admitting: Obstetrics and Gynecology

## 2017-09-27 DIAGNOSIS — Z1231 Encounter for screening mammogram for malignant neoplasm of breast: Secondary | ICD-10-CM

## 2017-09-27 DIAGNOSIS — J3081 Allergic rhinitis due to animal (cat) (dog) hair and dander: Secondary | ICD-10-CM | POA: Diagnosis not present

## 2017-09-27 DIAGNOSIS — J3089 Other allergic rhinitis: Secondary | ICD-10-CM | POA: Diagnosis not present

## 2017-09-27 DIAGNOSIS — J301 Allergic rhinitis due to pollen: Secondary | ICD-10-CM | POA: Diagnosis not present

## 2017-10-09 DIAGNOSIS — J3089 Other allergic rhinitis: Secondary | ICD-10-CM | POA: Diagnosis not present

## 2017-10-09 DIAGNOSIS — J3081 Allergic rhinitis due to animal (cat) (dog) hair and dander: Secondary | ICD-10-CM | POA: Diagnosis not present

## 2017-10-09 DIAGNOSIS — J301 Allergic rhinitis due to pollen: Secondary | ICD-10-CM | POA: Diagnosis not present

## 2017-10-17 DIAGNOSIS — R1909 Other intra-abdominal and pelvic swelling, mass and lump: Secondary | ICD-10-CM | POA: Diagnosis not present

## 2017-10-17 DIAGNOSIS — Z124 Encounter for screening for malignant neoplasm of cervix: Secondary | ICD-10-CM | POA: Diagnosis not present

## 2017-10-17 DIAGNOSIS — Z01419 Encounter for gynecological examination (general) (routine) without abnormal findings: Secondary | ICD-10-CM | POA: Diagnosis not present

## 2017-10-18 DIAGNOSIS — J301 Allergic rhinitis due to pollen: Secondary | ICD-10-CM | POA: Diagnosis not present

## 2017-10-18 DIAGNOSIS — J3089 Other allergic rhinitis: Secondary | ICD-10-CM | POA: Diagnosis not present

## 2017-10-18 DIAGNOSIS — J3081 Allergic rhinitis due to animal (cat) (dog) hair and dander: Secondary | ICD-10-CM | POA: Diagnosis not present

## 2017-10-25 DIAGNOSIS — J3089 Other allergic rhinitis: Secondary | ICD-10-CM | POA: Diagnosis not present

## 2017-10-25 DIAGNOSIS — J3081 Allergic rhinitis due to animal (cat) (dog) hair and dander: Secondary | ICD-10-CM | POA: Diagnosis not present

## 2017-10-25 DIAGNOSIS — J301 Allergic rhinitis due to pollen: Secondary | ICD-10-CM | POA: Diagnosis not present

## 2017-10-31 ENCOUNTER — Ambulatory Visit
Admission: RE | Admit: 2017-10-31 | Discharge: 2017-10-31 | Disposition: A | Discharge status: Home / Self Care | Payer: 59 | Source: Ambulatory Visit | Attending: Obstetrics and Gynecology | Admitting: Obstetrics and Gynecology

## 2017-10-31 DIAGNOSIS — Z1231 Encounter for screening mammogram for malignant neoplasm of breast: Secondary | ICD-10-CM

## 2017-11-01 DIAGNOSIS — J3081 Allergic rhinitis due to animal (cat) (dog) hair and dander: Secondary | ICD-10-CM | POA: Diagnosis not present

## 2017-11-01 DIAGNOSIS — J301 Allergic rhinitis due to pollen: Secondary | ICD-10-CM | POA: Diagnosis not present

## 2017-11-01 DIAGNOSIS — J3089 Other allergic rhinitis: Secondary | ICD-10-CM | POA: Diagnosis not present

## 2017-11-15 DIAGNOSIS — J301 Allergic rhinitis due to pollen: Secondary | ICD-10-CM | POA: Diagnosis not present

## 2017-11-15 DIAGNOSIS — J3081 Allergic rhinitis due to animal (cat) (dog) hair and dander: Secondary | ICD-10-CM | POA: Diagnosis not present

## 2017-11-15 DIAGNOSIS — J3089 Other allergic rhinitis: Secondary | ICD-10-CM | POA: Diagnosis not present

## 2017-11-20 DIAGNOSIS — J3089 Other allergic rhinitis: Secondary | ICD-10-CM | POA: Diagnosis not present

## 2017-11-20 DIAGNOSIS — J301 Allergic rhinitis due to pollen: Secondary | ICD-10-CM | POA: Diagnosis not present

## 2017-11-20 DIAGNOSIS — J3081 Allergic rhinitis due to animal (cat) (dog) hair and dander: Secondary | ICD-10-CM | POA: Diagnosis not present

## 2017-11-29 DIAGNOSIS — J3089 Other allergic rhinitis: Secondary | ICD-10-CM | POA: Diagnosis not present

## 2017-11-29 DIAGNOSIS — J3081 Allergic rhinitis due to animal (cat) (dog) hair and dander: Secondary | ICD-10-CM | POA: Diagnosis not present

## 2017-11-29 DIAGNOSIS — J301 Allergic rhinitis due to pollen: Secondary | ICD-10-CM | POA: Diagnosis not present

## 2017-12-13 DIAGNOSIS — J3081 Allergic rhinitis due to animal (cat) (dog) hair and dander: Secondary | ICD-10-CM | POA: Diagnosis not present

## 2017-12-13 DIAGNOSIS — J3089 Other allergic rhinitis: Secondary | ICD-10-CM | POA: Diagnosis not present

## 2017-12-13 DIAGNOSIS — J301 Allergic rhinitis due to pollen: Secondary | ICD-10-CM | POA: Diagnosis not present

## 2018-01-07 DIAGNOSIS — H25013 Cortical age-related cataract, bilateral: Secondary | ICD-10-CM | POA: Diagnosis not present

## 2018-01-07 DIAGNOSIS — H04123 Dry eye syndrome of bilateral lacrimal glands: Secondary | ICD-10-CM | POA: Diagnosis not present

## 2018-01-07 DIAGNOSIS — H35363 Drusen (degenerative) of macula, bilateral: Secondary | ICD-10-CM | POA: Diagnosis not present

## 2018-01-07 DIAGNOSIS — H2513 Age-related nuclear cataract, bilateral: Secondary | ICD-10-CM | POA: Diagnosis not present

## 2018-01-10 DIAGNOSIS — R1032 Left lower quadrant pain: Secondary | ICD-10-CM | POA: Diagnosis not present

## 2018-01-14 DIAGNOSIS — J310 Chronic rhinitis: Secondary | ICD-10-CM | POA: Diagnosis not present

## 2018-01-14 DIAGNOSIS — J342 Deviated nasal septum: Secondary | ICD-10-CM | POA: Diagnosis not present

## 2018-01-14 DIAGNOSIS — J343 Hypertrophy of nasal turbinates: Secondary | ICD-10-CM | POA: Diagnosis not present

## 2018-01-21 ENCOUNTER — Other Ambulatory Visit: Payer: Self-pay | Admitting: Otolaryngology

## 2018-01-21 DIAGNOSIS — J329 Chronic sinusitis, unspecified: Secondary | ICD-10-CM

## 2018-01-30 ENCOUNTER — Ambulatory Visit
Admission: RE | Admit: 2018-01-30 | Discharge: 2018-01-30 | Disposition: A | Discharge status: Home / Self Care | Payer: 59 | Source: Ambulatory Visit | Attending: Otolaryngology | Admitting: Otolaryngology

## 2018-01-30 DIAGNOSIS — J329 Chronic sinusitis, unspecified: Secondary | ICD-10-CM

## 2018-01-31 DIAGNOSIS — J3089 Other allergic rhinitis: Secondary | ICD-10-CM | POA: Diagnosis not present

## 2018-01-31 DIAGNOSIS — J3081 Allergic rhinitis due to animal (cat) (dog) hair and dander: Secondary | ICD-10-CM | POA: Diagnosis not present

## 2018-01-31 DIAGNOSIS — J301 Allergic rhinitis due to pollen: Secondary | ICD-10-CM | POA: Diagnosis not present

## 2018-02-05 DIAGNOSIS — J329 Chronic sinusitis, unspecified: Secondary | ICD-10-CM | POA: Diagnosis not present

## 2018-02-05 DIAGNOSIS — J309 Allergic rhinitis, unspecified: Secondary | ICD-10-CM | POA: Diagnosis not present

## 2018-02-11 DIAGNOSIS — J343 Hypertrophy of nasal turbinates: Secondary | ICD-10-CM | POA: Diagnosis not present

## 2018-02-11 DIAGNOSIS — J310 Chronic rhinitis: Secondary | ICD-10-CM | POA: Diagnosis not present

## 2018-04-22 DIAGNOSIS — H0102B Squamous blepharitis left eye, upper and lower eyelids: Secondary | ICD-10-CM | POA: Diagnosis not present

## 2018-04-22 DIAGNOSIS — H04123 Dry eye syndrome of bilateral lacrimal glands: Secondary | ICD-10-CM | POA: Diagnosis not present

## 2018-04-22 DIAGNOSIS — H0102A Squamous blepharitis right eye, upper and lower eyelids: Secondary | ICD-10-CM | POA: Diagnosis not present

## 2018-04-29 DIAGNOSIS — J301 Allergic rhinitis due to pollen: Secondary | ICD-10-CM | POA: Diagnosis not present

## 2018-04-29 DIAGNOSIS — J3089 Other allergic rhinitis: Secondary | ICD-10-CM | POA: Diagnosis not present

## 2018-04-29 DIAGNOSIS — J3081 Allergic rhinitis due to animal (cat) (dog) hair and dander: Secondary | ICD-10-CM | POA: Diagnosis not present

## 2018-05-07 DIAGNOSIS — J3089 Other allergic rhinitis: Secondary | ICD-10-CM | POA: Diagnosis not present

## 2018-05-07 DIAGNOSIS — J3081 Allergic rhinitis due to animal (cat) (dog) hair and dander: Secondary | ICD-10-CM | POA: Diagnosis not present

## 2018-05-07 DIAGNOSIS — J301 Allergic rhinitis due to pollen: Secondary | ICD-10-CM | POA: Diagnosis not present

## 2018-05-14 DIAGNOSIS — J3081 Allergic rhinitis due to animal (cat) (dog) hair and dander: Secondary | ICD-10-CM | POA: Diagnosis not present

## 2018-05-14 DIAGNOSIS — J301 Allergic rhinitis due to pollen: Secondary | ICD-10-CM | POA: Diagnosis not present

## 2018-05-14 DIAGNOSIS — J3089 Other allergic rhinitis: Secondary | ICD-10-CM | POA: Diagnosis not present

## 2018-05-21 DIAGNOSIS — J3081 Allergic rhinitis due to animal (cat) (dog) hair and dander: Secondary | ICD-10-CM | POA: Diagnosis not present

## 2018-05-21 DIAGNOSIS — J3089 Other allergic rhinitis: Secondary | ICD-10-CM | POA: Diagnosis not present

## 2018-05-21 DIAGNOSIS — J301 Allergic rhinitis due to pollen: Secondary | ICD-10-CM | POA: Diagnosis not present

## 2018-05-30 DIAGNOSIS — J3081 Allergic rhinitis due to animal (cat) (dog) hair and dander: Secondary | ICD-10-CM | POA: Diagnosis not present

## 2018-05-30 DIAGNOSIS — J301 Allergic rhinitis due to pollen: Secondary | ICD-10-CM | POA: Diagnosis not present

## 2018-05-30 DIAGNOSIS — J3089 Other allergic rhinitis: Secondary | ICD-10-CM | POA: Diagnosis not present

## 2018-06-11 DIAGNOSIS — J301 Allergic rhinitis due to pollen: Secondary | ICD-10-CM | POA: Diagnosis not present

## 2018-06-11 DIAGNOSIS — J3089 Other allergic rhinitis: Secondary | ICD-10-CM | POA: Diagnosis not present

## 2018-06-11 DIAGNOSIS — J3081 Allergic rhinitis due to animal (cat) (dog) hair and dander: Secondary | ICD-10-CM | POA: Diagnosis not present

## 2018-07-09 DIAGNOSIS — J3081 Allergic rhinitis due to animal (cat) (dog) hair and dander: Secondary | ICD-10-CM | POA: Diagnosis not present

## 2018-07-09 DIAGNOSIS — J301 Allergic rhinitis due to pollen: Secondary | ICD-10-CM | POA: Diagnosis not present

## 2018-07-09 DIAGNOSIS — J3089 Other allergic rhinitis: Secondary | ICD-10-CM | POA: Diagnosis not present

## 2018-07-18 DIAGNOSIS — J3081 Allergic rhinitis due to animal (cat) (dog) hair and dander: Secondary | ICD-10-CM | POA: Diagnosis not present

## 2018-07-18 DIAGNOSIS — J301 Allergic rhinitis due to pollen: Secondary | ICD-10-CM | POA: Diagnosis not present

## 2018-07-18 DIAGNOSIS — J3089 Other allergic rhinitis: Secondary | ICD-10-CM | POA: Diagnosis not present

## 2018-07-23 DIAGNOSIS — J3089 Other allergic rhinitis: Secondary | ICD-10-CM | POA: Diagnosis not present

## 2018-07-23 DIAGNOSIS — J3081 Allergic rhinitis due to animal (cat) (dog) hair and dander: Secondary | ICD-10-CM | POA: Diagnosis not present

## 2018-07-23 DIAGNOSIS — J301 Allergic rhinitis due to pollen: Secondary | ICD-10-CM | POA: Diagnosis not present

## 2018-08-01 DIAGNOSIS — J3089 Other allergic rhinitis: Secondary | ICD-10-CM | POA: Diagnosis not present

## 2018-08-01 DIAGNOSIS — J3081 Allergic rhinitis due to animal (cat) (dog) hair and dander: Secondary | ICD-10-CM | POA: Diagnosis not present

## 2018-08-01 DIAGNOSIS — J301 Allergic rhinitis due to pollen: Secondary | ICD-10-CM | POA: Diagnosis not present

## 2018-08-08 DIAGNOSIS — J301 Allergic rhinitis due to pollen: Secondary | ICD-10-CM | POA: Diagnosis not present

## 2018-08-08 DIAGNOSIS — J3081 Allergic rhinitis due to animal (cat) (dog) hair and dander: Secondary | ICD-10-CM | POA: Diagnosis not present

## 2018-08-08 DIAGNOSIS — J3089 Other allergic rhinitis: Secondary | ICD-10-CM | POA: Diagnosis not present

## 2018-08-13 DIAGNOSIS — J301 Allergic rhinitis due to pollen: Secondary | ICD-10-CM | POA: Diagnosis not present

## 2018-08-13 DIAGNOSIS — J3081 Allergic rhinitis due to animal (cat) (dog) hair and dander: Secondary | ICD-10-CM | POA: Diagnosis not present

## 2018-08-13 DIAGNOSIS — J3089 Other allergic rhinitis: Secondary | ICD-10-CM | POA: Diagnosis not present

## 2018-08-14 DIAGNOSIS — J3081 Allergic rhinitis due to animal (cat) (dog) hair and dander: Secondary | ICD-10-CM | POA: Diagnosis not present

## 2018-08-14 DIAGNOSIS — J3089 Other allergic rhinitis: Secondary | ICD-10-CM | POA: Diagnosis not present

## 2018-08-22 DIAGNOSIS — J3089 Other allergic rhinitis: Secondary | ICD-10-CM | POA: Diagnosis not present

## 2018-08-22 DIAGNOSIS — J3081 Allergic rhinitis due to animal (cat) (dog) hair and dander: Secondary | ICD-10-CM | POA: Diagnosis not present

## 2018-08-22 DIAGNOSIS — J301 Allergic rhinitis due to pollen: Secondary | ICD-10-CM | POA: Diagnosis not present

## 2018-08-29 DIAGNOSIS — J301 Allergic rhinitis due to pollen: Secondary | ICD-10-CM | POA: Diagnosis not present

## 2018-08-29 DIAGNOSIS — J3081 Allergic rhinitis due to animal (cat) (dog) hair and dander: Secondary | ICD-10-CM | POA: Diagnosis not present

## 2018-08-29 DIAGNOSIS — J3089 Other allergic rhinitis: Secondary | ICD-10-CM | POA: Diagnosis not present

## 2018-09-03 DIAGNOSIS — J301 Allergic rhinitis due to pollen: Secondary | ICD-10-CM | POA: Diagnosis not present

## 2018-09-03 DIAGNOSIS — J3089 Other allergic rhinitis: Secondary | ICD-10-CM | POA: Diagnosis not present

## 2018-09-10 DIAGNOSIS — J301 Allergic rhinitis due to pollen: Secondary | ICD-10-CM | POA: Diagnosis not present

## 2018-09-10 DIAGNOSIS — J3089 Other allergic rhinitis: Secondary | ICD-10-CM | POA: Diagnosis not present

## 2018-09-10 DIAGNOSIS — J3081 Allergic rhinitis due to animal (cat) (dog) hair and dander: Secondary | ICD-10-CM | POA: Diagnosis not present

## 2018-09-17 DIAGNOSIS — J3089 Other allergic rhinitis: Secondary | ICD-10-CM | POA: Diagnosis not present

## 2018-09-17 DIAGNOSIS — J301 Allergic rhinitis due to pollen: Secondary | ICD-10-CM | POA: Diagnosis not present

## 2018-09-17 DIAGNOSIS — J3081 Allergic rhinitis due to animal (cat) (dog) hair and dander: Secondary | ICD-10-CM | POA: Diagnosis not present

## 2018-09-22 ENCOUNTER — Other Ambulatory Visit: Payer: Self-pay | Admitting: Obstetrics and Gynecology

## 2018-09-22 DIAGNOSIS — Z1231 Encounter for screening mammogram for malignant neoplasm of breast: Secondary | ICD-10-CM

## 2018-09-24 DIAGNOSIS — J3081 Allergic rhinitis due to animal (cat) (dog) hair and dander: Secondary | ICD-10-CM | POA: Diagnosis not present

## 2018-09-24 DIAGNOSIS — J3089 Other allergic rhinitis: Secondary | ICD-10-CM | POA: Diagnosis not present

## 2018-09-24 DIAGNOSIS — J301 Allergic rhinitis due to pollen: Secondary | ICD-10-CM | POA: Diagnosis not present

## 2018-10-01 DIAGNOSIS — J301 Allergic rhinitis due to pollen: Secondary | ICD-10-CM | POA: Diagnosis not present

## 2018-10-01 DIAGNOSIS — J3089 Other allergic rhinitis: Secondary | ICD-10-CM | POA: Diagnosis not present

## 2018-10-01 DIAGNOSIS — J3081 Allergic rhinitis due to animal (cat) (dog) hair and dander: Secondary | ICD-10-CM | POA: Diagnosis not present

## 2018-10-08 DIAGNOSIS — J3081 Allergic rhinitis due to animal (cat) (dog) hair and dander: Secondary | ICD-10-CM | POA: Diagnosis not present

## 2018-10-08 DIAGNOSIS — J301 Allergic rhinitis due to pollen: Secondary | ICD-10-CM | POA: Diagnosis not present

## 2018-10-08 DIAGNOSIS — J3089 Other allergic rhinitis: Secondary | ICD-10-CM | POA: Diagnosis not present

## 2018-10-15 DIAGNOSIS — J3081 Allergic rhinitis due to animal (cat) (dog) hair and dander: Secondary | ICD-10-CM | POA: Diagnosis not present

## 2018-10-15 DIAGNOSIS — J301 Allergic rhinitis due to pollen: Secondary | ICD-10-CM | POA: Diagnosis not present

## 2018-10-15 DIAGNOSIS — J3089 Other allergic rhinitis: Secondary | ICD-10-CM | POA: Diagnosis not present

## 2018-10-22 DIAGNOSIS — J301 Allergic rhinitis due to pollen: Secondary | ICD-10-CM | POA: Diagnosis not present

## 2018-10-22 DIAGNOSIS — J3089 Other allergic rhinitis: Secondary | ICD-10-CM | POA: Diagnosis not present

## 2018-11-05 DIAGNOSIS — J3081 Allergic rhinitis due to animal (cat) (dog) hair and dander: Secondary | ICD-10-CM | POA: Diagnosis not present

## 2018-11-05 DIAGNOSIS — J301 Allergic rhinitis due to pollen: Secondary | ICD-10-CM | POA: Diagnosis not present

## 2018-11-05 DIAGNOSIS — J3089 Other allergic rhinitis: Secondary | ICD-10-CM | POA: Diagnosis not present

## 2018-11-13 ENCOUNTER — Ambulatory Visit
Admission: RE | Admit: 2018-11-13 | Discharge: 2018-11-13 | Disposition: A | Discharge status: Home / Self Care | Payer: BC Managed Care – PPO | Source: Ambulatory Visit | Attending: Obstetrics and Gynecology | Admitting: Obstetrics and Gynecology

## 2018-11-13 ENCOUNTER — Other Ambulatory Visit: Payer: Self-pay

## 2018-11-13 DIAGNOSIS — Z124 Encounter for screening for malignant neoplasm of cervix: Secondary | ICD-10-CM | POA: Diagnosis not present

## 2018-11-13 DIAGNOSIS — R1909 Other intra-abdominal and pelvic swelling, mass and lump: Secondary | ICD-10-CM | POA: Diagnosis not present

## 2018-11-13 DIAGNOSIS — Z1231 Encounter for screening mammogram for malignant neoplasm of breast: Secondary | ICD-10-CM | POA: Diagnosis not present

## 2018-11-13 DIAGNOSIS — J3089 Other allergic rhinitis: Secondary | ICD-10-CM | POA: Diagnosis not present

## 2018-11-13 DIAGNOSIS — Z6824 Body mass index (BMI) 24.0-24.9, adult: Secondary | ICD-10-CM | POA: Diagnosis not present

## 2018-11-13 DIAGNOSIS — Z01419 Encounter for gynecological examination (general) (routine) without abnormal findings: Secondary | ICD-10-CM | POA: Diagnosis not present

## 2018-11-13 DIAGNOSIS — J3081 Allergic rhinitis due to animal (cat) (dog) hair and dander: Secondary | ICD-10-CM | POA: Diagnosis not present

## 2018-11-13 DIAGNOSIS — J301 Allergic rhinitis due to pollen: Secondary | ICD-10-CM | POA: Diagnosis not present

## 2018-11-18 ENCOUNTER — Other Ambulatory Visit: Payer: Self-pay | Admitting: Obstetrics and Gynecology

## 2018-11-18 DIAGNOSIS — R928 Other abnormal and inconclusive findings on diagnostic imaging of breast: Secondary | ICD-10-CM

## 2018-11-21 ENCOUNTER — Other Ambulatory Visit: Payer: Self-pay

## 2018-11-21 ENCOUNTER — Ambulatory Visit
Admission: RE | Admit: 2018-11-21 | Discharge: 2018-11-21 | Disposition: A | Discharge status: Home / Self Care | Payer: BC Managed Care – PPO | Source: Ambulatory Visit | Attending: Obstetrics and Gynecology | Admitting: Obstetrics and Gynecology

## 2018-11-21 DIAGNOSIS — J3081 Allergic rhinitis due to animal (cat) (dog) hair and dander: Secondary | ICD-10-CM | POA: Diagnosis not present

## 2018-11-21 DIAGNOSIS — J301 Allergic rhinitis due to pollen: Secondary | ICD-10-CM | POA: Diagnosis not present

## 2018-11-21 DIAGNOSIS — R928 Other abnormal and inconclusive findings on diagnostic imaging of breast: Secondary | ICD-10-CM | POA: Diagnosis not present

## 2018-11-21 DIAGNOSIS — J3089 Other allergic rhinitis: Secondary | ICD-10-CM | POA: Diagnosis not present

## 2018-11-26 DIAGNOSIS — J3081 Allergic rhinitis due to animal (cat) (dog) hair and dander: Secondary | ICD-10-CM | POA: Diagnosis not present

## 2018-11-26 DIAGNOSIS — J301 Allergic rhinitis due to pollen: Secondary | ICD-10-CM | POA: Diagnosis not present

## 2018-11-26 DIAGNOSIS — J3089 Other allergic rhinitis: Secondary | ICD-10-CM | POA: Diagnosis not present

## 2018-12-02 DIAGNOSIS — J301 Allergic rhinitis due to pollen: Secondary | ICD-10-CM | POA: Diagnosis not present

## 2018-12-02 DIAGNOSIS — J3081 Allergic rhinitis due to animal (cat) (dog) hair and dander: Secondary | ICD-10-CM | POA: Diagnosis not present

## 2018-12-02 DIAGNOSIS — J3089 Other allergic rhinitis: Secondary | ICD-10-CM | POA: Diagnosis not present

## 2018-12-11 DIAGNOSIS — J3081 Allergic rhinitis due to animal (cat) (dog) hair and dander: Secondary | ICD-10-CM | POA: Diagnosis not present

## 2018-12-11 DIAGNOSIS — J3089 Other allergic rhinitis: Secondary | ICD-10-CM | POA: Diagnosis not present

## 2018-12-11 DIAGNOSIS — J301 Allergic rhinitis due to pollen: Secondary | ICD-10-CM | POA: Diagnosis not present

## 2018-12-19 DIAGNOSIS — J301 Allergic rhinitis due to pollen: Secondary | ICD-10-CM | POA: Diagnosis not present

## 2018-12-19 DIAGNOSIS — J3081 Allergic rhinitis due to animal (cat) (dog) hair and dander: Secondary | ICD-10-CM | POA: Diagnosis not present

## 2018-12-19 DIAGNOSIS — J3089 Other allergic rhinitis: Secondary | ICD-10-CM | POA: Diagnosis not present

## 2018-12-22 DIAGNOSIS — J3081 Allergic rhinitis due to animal (cat) (dog) hair and dander: Secondary | ICD-10-CM | POA: Diagnosis not present

## 2018-12-22 DIAGNOSIS — H2513 Age-related nuclear cataract, bilateral: Secondary | ICD-10-CM | POA: Diagnosis not present

## 2018-12-22 DIAGNOSIS — H35372 Puckering of macula, left eye: Secondary | ICD-10-CM | POA: Diagnosis not present

## 2018-12-22 DIAGNOSIS — H25013 Cortical age-related cataract, bilateral: Secondary | ICD-10-CM | POA: Diagnosis not present

## 2018-12-22 DIAGNOSIS — H35363 Drusen (degenerative) of macula, bilateral: Secondary | ICD-10-CM | POA: Diagnosis not present

## 2018-12-22 DIAGNOSIS — J3089 Other allergic rhinitis: Secondary | ICD-10-CM | POA: Diagnosis not present

## 2018-12-22 DIAGNOSIS — J301 Allergic rhinitis due to pollen: Secondary | ICD-10-CM | POA: Diagnosis not present

## 2019-01-13 DIAGNOSIS — Z03818 Encounter for observation for suspected exposure to other biological agents ruled out: Secondary | ICD-10-CM | POA: Diagnosis not present

## 2019-03-11 DIAGNOSIS — J301 Allergic rhinitis due to pollen: Secondary | ICD-10-CM | POA: Diagnosis not present

## 2019-03-11 DIAGNOSIS — J3089 Other allergic rhinitis: Secondary | ICD-10-CM | POA: Diagnosis not present

## 2019-03-11 DIAGNOSIS — J3081 Allergic rhinitis due to animal (cat) (dog) hair and dander: Secondary | ICD-10-CM | POA: Diagnosis not present

## 2019-03-16 DIAGNOSIS — J301 Allergic rhinitis due to pollen: Secondary | ICD-10-CM | POA: Diagnosis not present

## 2019-03-16 DIAGNOSIS — J3089 Other allergic rhinitis: Secondary | ICD-10-CM | POA: Diagnosis not present

## 2019-03-16 DIAGNOSIS — J3081 Allergic rhinitis due to animal (cat) (dog) hair and dander: Secondary | ICD-10-CM | POA: Diagnosis not present

## 2019-03-19 DIAGNOSIS — J3089 Other allergic rhinitis: Secondary | ICD-10-CM | POA: Diagnosis not present

## 2019-03-19 DIAGNOSIS — J3081 Allergic rhinitis due to animal (cat) (dog) hair and dander: Secondary | ICD-10-CM | POA: Diagnosis not present

## 2019-03-19 DIAGNOSIS — J301 Allergic rhinitis due to pollen: Secondary | ICD-10-CM | POA: Diagnosis not present

## 2019-03-25 DIAGNOSIS — J301 Allergic rhinitis due to pollen: Secondary | ICD-10-CM | POA: Diagnosis not present

## 2019-03-25 DIAGNOSIS — J3081 Allergic rhinitis due to animal (cat) (dog) hair and dander: Secondary | ICD-10-CM | POA: Diagnosis not present

## 2019-03-25 DIAGNOSIS — J3089 Other allergic rhinitis: Secondary | ICD-10-CM | POA: Diagnosis not present

## 2019-04-01 DIAGNOSIS — J3081 Allergic rhinitis due to animal (cat) (dog) hair and dander: Secondary | ICD-10-CM | POA: Diagnosis not present

## 2019-04-01 DIAGNOSIS — J3089 Other allergic rhinitis: Secondary | ICD-10-CM | POA: Diagnosis not present

## 2019-04-01 DIAGNOSIS — J301 Allergic rhinitis due to pollen: Secondary | ICD-10-CM | POA: Diagnosis not present

## 2019-04-08 DIAGNOSIS — J3089 Other allergic rhinitis: Secondary | ICD-10-CM | POA: Diagnosis not present

## 2019-04-08 DIAGNOSIS — J3081 Allergic rhinitis due to animal (cat) (dog) hair and dander: Secondary | ICD-10-CM | POA: Diagnosis not present

## 2019-04-08 DIAGNOSIS — J301 Allergic rhinitis due to pollen: Secondary | ICD-10-CM | POA: Diagnosis not present

## 2019-04-16 DIAGNOSIS — J301 Allergic rhinitis due to pollen: Secondary | ICD-10-CM | POA: Diagnosis not present

## 2019-04-16 DIAGNOSIS — J3081 Allergic rhinitis due to animal (cat) (dog) hair and dander: Secondary | ICD-10-CM | POA: Diagnosis not present

## 2019-04-16 DIAGNOSIS — J3089 Other allergic rhinitis: Secondary | ICD-10-CM | POA: Diagnosis not present

## 2019-04-21 DIAGNOSIS — J3081 Allergic rhinitis due to animal (cat) (dog) hair and dander: Secondary | ICD-10-CM | POA: Diagnosis not present

## 2019-04-21 DIAGNOSIS — J3089 Other allergic rhinitis: Secondary | ICD-10-CM | POA: Diagnosis not present

## 2019-04-21 DIAGNOSIS — J301 Allergic rhinitis due to pollen: Secondary | ICD-10-CM | POA: Diagnosis not present

## 2019-05-06 DIAGNOSIS — J3089 Other allergic rhinitis: Secondary | ICD-10-CM | POA: Diagnosis not present

## 2019-05-06 DIAGNOSIS — J3081 Allergic rhinitis due to animal (cat) (dog) hair and dander: Secondary | ICD-10-CM | POA: Diagnosis not present

## 2019-05-06 DIAGNOSIS — J301 Allergic rhinitis due to pollen: Secondary | ICD-10-CM | POA: Diagnosis not present

## 2019-05-25 DIAGNOSIS — Z0000 Encounter for general adult medical examination without abnormal findings: Secondary | ICD-10-CM | POA: Diagnosis not present

## 2019-05-25 DIAGNOSIS — W57XXXA Bitten or stung by nonvenomous insect and other nonvenomous arthropods, initial encounter: Secondary | ICD-10-CM | POA: Diagnosis not present

## 2019-05-25 DIAGNOSIS — S40862A Insect bite (nonvenomous) of left upper arm, initial encounter: Secondary | ICD-10-CM | POA: Diagnosis not present

## 2019-05-28 DIAGNOSIS — Z0000 Encounter for general adult medical examination without abnormal findings: Secondary | ICD-10-CM | POA: Diagnosis not present

## 2019-07-15 DIAGNOSIS — Z23 Encounter for immunization: Secondary | ICD-10-CM | POA: Diagnosis not present

## 2019-08-03 DIAGNOSIS — J301 Allergic rhinitis due to pollen: Secondary | ICD-10-CM | POA: Diagnosis not present

## 2019-08-04 DIAGNOSIS — J3081 Allergic rhinitis due to animal (cat) (dog) hair and dander: Secondary | ICD-10-CM | POA: Diagnosis not present

## 2019-08-04 DIAGNOSIS — J3089 Other allergic rhinitis: Secondary | ICD-10-CM | POA: Diagnosis not present

## 2019-08-05 DIAGNOSIS — J3089 Other allergic rhinitis: Secondary | ICD-10-CM | POA: Diagnosis not present

## 2019-08-05 DIAGNOSIS — J301 Allergic rhinitis due to pollen: Secondary | ICD-10-CM | POA: Diagnosis not present

## 2019-08-05 DIAGNOSIS — J3081 Allergic rhinitis due to animal (cat) (dog) hair and dander: Secondary | ICD-10-CM | POA: Diagnosis not present

## 2019-08-12 DIAGNOSIS — J3081 Allergic rhinitis due to animal (cat) (dog) hair and dander: Secondary | ICD-10-CM | POA: Diagnosis not present

## 2019-08-12 DIAGNOSIS — J301 Allergic rhinitis due to pollen: Secondary | ICD-10-CM | POA: Diagnosis not present

## 2019-08-12 DIAGNOSIS — J3089 Other allergic rhinitis: Secondary | ICD-10-CM | POA: Diagnosis not present

## 2019-08-19 DIAGNOSIS — J3089 Other allergic rhinitis: Secondary | ICD-10-CM | POA: Diagnosis not present

## 2019-08-19 DIAGNOSIS — J3081 Allergic rhinitis due to animal (cat) (dog) hair and dander: Secondary | ICD-10-CM | POA: Diagnosis not present

## 2019-08-19 DIAGNOSIS — J301 Allergic rhinitis due to pollen: Secondary | ICD-10-CM | POA: Diagnosis not present

## 2019-08-26 DIAGNOSIS — J3089 Other allergic rhinitis: Secondary | ICD-10-CM | POA: Diagnosis not present

## 2019-08-26 DIAGNOSIS — J301 Allergic rhinitis due to pollen: Secondary | ICD-10-CM | POA: Diagnosis not present

## 2019-08-26 DIAGNOSIS — J3081 Allergic rhinitis due to animal (cat) (dog) hair and dander: Secondary | ICD-10-CM | POA: Diagnosis not present

## 2019-09-03 DIAGNOSIS — J3081 Allergic rhinitis due to animal (cat) (dog) hair and dander: Secondary | ICD-10-CM | POA: Diagnosis not present

## 2019-09-03 DIAGNOSIS — J3089 Other allergic rhinitis: Secondary | ICD-10-CM | POA: Diagnosis not present

## 2019-09-03 DIAGNOSIS — J301 Allergic rhinitis due to pollen: Secondary | ICD-10-CM | POA: Diagnosis not present

## 2019-09-10 DIAGNOSIS — J301 Allergic rhinitis due to pollen: Secondary | ICD-10-CM | POA: Diagnosis not present

## 2019-09-10 DIAGNOSIS — J3089 Other allergic rhinitis: Secondary | ICD-10-CM | POA: Diagnosis not present

## 2019-09-10 DIAGNOSIS — J3081 Allergic rhinitis due to animal (cat) (dog) hair and dander: Secondary | ICD-10-CM | POA: Diagnosis not present

## 2019-09-16 DIAGNOSIS — J3081 Allergic rhinitis due to animal (cat) (dog) hair and dander: Secondary | ICD-10-CM | POA: Diagnosis not present

## 2019-09-16 DIAGNOSIS — J3089 Other allergic rhinitis: Secondary | ICD-10-CM | POA: Diagnosis not present

## 2019-09-16 DIAGNOSIS — J301 Allergic rhinitis due to pollen: Secondary | ICD-10-CM | POA: Diagnosis not present

## 2019-09-25 DIAGNOSIS — J3089 Other allergic rhinitis: Secondary | ICD-10-CM | POA: Diagnosis not present

## 2019-09-25 DIAGNOSIS — J301 Allergic rhinitis due to pollen: Secondary | ICD-10-CM | POA: Diagnosis not present

## 2019-09-25 DIAGNOSIS — J3081 Allergic rhinitis due to animal (cat) (dog) hair and dander: Secondary | ICD-10-CM | POA: Diagnosis not present

## 2019-09-30 DIAGNOSIS — J301 Allergic rhinitis due to pollen: Secondary | ICD-10-CM | POA: Diagnosis not present

## 2019-09-30 DIAGNOSIS — J3081 Allergic rhinitis due to animal (cat) (dog) hair and dander: Secondary | ICD-10-CM | POA: Diagnosis not present

## 2019-09-30 DIAGNOSIS — J3089 Other allergic rhinitis: Secondary | ICD-10-CM | POA: Diagnosis not present

## 2019-10-07 DIAGNOSIS — J3081 Allergic rhinitis due to animal (cat) (dog) hair and dander: Secondary | ICD-10-CM | POA: Diagnosis not present

## 2019-10-07 DIAGNOSIS — J3089 Other allergic rhinitis: Secondary | ICD-10-CM | POA: Diagnosis not present

## 2019-10-07 DIAGNOSIS — J301 Allergic rhinitis due to pollen: Secondary | ICD-10-CM | POA: Diagnosis not present

## 2019-10-09 ENCOUNTER — Other Ambulatory Visit: Payer: Self-pay | Admitting: Obstetrics and Gynecology

## 2019-10-09 DIAGNOSIS — Z0000 Encounter for general adult medical examination without abnormal findings: Secondary | ICD-10-CM

## 2019-10-14 DIAGNOSIS — J3081 Allergic rhinitis due to animal (cat) (dog) hair and dander: Secondary | ICD-10-CM | POA: Diagnosis not present

## 2019-10-14 DIAGNOSIS — J3089 Other allergic rhinitis: Secondary | ICD-10-CM | POA: Diagnosis not present

## 2019-10-14 DIAGNOSIS — J301 Allergic rhinitis due to pollen: Secondary | ICD-10-CM | POA: Diagnosis not present

## 2019-10-21 DIAGNOSIS — J3081 Allergic rhinitis due to animal (cat) (dog) hair and dander: Secondary | ICD-10-CM | POA: Diagnosis not present

## 2019-10-21 DIAGNOSIS — J301 Allergic rhinitis due to pollen: Secondary | ICD-10-CM | POA: Diagnosis not present

## 2019-10-21 DIAGNOSIS — J3089 Other allergic rhinitis: Secondary | ICD-10-CM | POA: Diagnosis not present

## 2019-10-28 DIAGNOSIS — J3089 Other allergic rhinitis: Secondary | ICD-10-CM | POA: Diagnosis not present

## 2019-10-28 DIAGNOSIS — J301 Allergic rhinitis due to pollen: Secondary | ICD-10-CM | POA: Diagnosis not present

## 2019-10-28 DIAGNOSIS — J3081 Allergic rhinitis due to animal (cat) (dog) hair and dander: Secondary | ICD-10-CM | POA: Diagnosis not present

## 2019-11-03 DIAGNOSIS — J3089 Other allergic rhinitis: Secondary | ICD-10-CM | POA: Diagnosis not present

## 2019-11-03 DIAGNOSIS — J3081 Allergic rhinitis due to animal (cat) (dog) hair and dander: Secondary | ICD-10-CM | POA: Diagnosis not present

## 2019-11-03 DIAGNOSIS — J301 Allergic rhinitis due to pollen: Secondary | ICD-10-CM | POA: Diagnosis not present

## 2019-11-12 DIAGNOSIS — J3081 Allergic rhinitis due to animal (cat) (dog) hair and dander: Secondary | ICD-10-CM | POA: Diagnosis not present

## 2019-11-12 DIAGNOSIS — J301 Allergic rhinitis due to pollen: Secondary | ICD-10-CM | POA: Diagnosis not present

## 2019-11-12 DIAGNOSIS — J3089 Other allergic rhinitis: Secondary | ICD-10-CM | POA: Diagnosis not present

## 2019-11-18 DIAGNOSIS — J3089 Other allergic rhinitis: Secondary | ICD-10-CM | POA: Diagnosis not present

## 2019-11-18 DIAGNOSIS — J301 Allergic rhinitis due to pollen: Secondary | ICD-10-CM | POA: Diagnosis not present

## 2019-11-18 DIAGNOSIS — J3081 Allergic rhinitis due to animal (cat) (dog) hair and dander: Secondary | ICD-10-CM | POA: Diagnosis not present

## 2019-11-30 ENCOUNTER — Ambulatory Visit
Admission: RE | Admit: 2019-11-30 | Discharge: 2019-11-30 | Disposition: A | Discharge status: Home / Self Care | Payer: BC Managed Care – PPO | Source: Ambulatory Visit | Attending: Obstetrics and Gynecology | Admitting: Obstetrics and Gynecology

## 2019-11-30 ENCOUNTER — Other Ambulatory Visit: Payer: Self-pay

## 2019-11-30 DIAGNOSIS — J3089 Other allergic rhinitis: Secondary | ICD-10-CM | POA: Diagnosis not present

## 2019-11-30 DIAGNOSIS — J3081 Allergic rhinitis due to animal (cat) (dog) hair and dander: Secondary | ICD-10-CM | POA: Diagnosis not present

## 2019-11-30 DIAGNOSIS — J301 Allergic rhinitis due to pollen: Secondary | ICD-10-CM | POA: Diagnosis not present

## 2019-11-30 DIAGNOSIS — Z0000 Encounter for general adult medical examination without abnormal findings: Secondary | ICD-10-CM

## 2019-11-30 DIAGNOSIS — Z1231 Encounter for screening mammogram for malignant neoplasm of breast: Secondary | ICD-10-CM | POA: Diagnosis not present

## 2019-12-11 DIAGNOSIS — J301 Allergic rhinitis due to pollen: Secondary | ICD-10-CM | POA: Diagnosis not present

## 2019-12-11 DIAGNOSIS — J3089 Other allergic rhinitis: Secondary | ICD-10-CM | POA: Diagnosis not present

## 2019-12-11 DIAGNOSIS — J3081 Allergic rhinitis due to animal (cat) (dog) hair and dander: Secondary | ICD-10-CM | POA: Diagnosis not present

## 2019-12-17 DIAGNOSIS — K098 Other cysts of oral region, not elsewhere classified: Secondary | ICD-10-CM | POA: Diagnosis not present

## 2019-12-17 DIAGNOSIS — Z01419 Encounter for gynecological examination (general) (routine) without abnormal findings: Secondary | ICD-10-CM | POA: Diagnosis not present

## 2019-12-17 DIAGNOSIS — D279 Benign neoplasm of unspecified ovary: Secondary | ICD-10-CM | POA: Diagnosis not present

## 2020-01-06 DIAGNOSIS — J3081 Allergic rhinitis due to animal (cat) (dog) hair and dander: Secondary | ICD-10-CM | POA: Diagnosis not present

## 2020-01-06 DIAGNOSIS — J3089 Other allergic rhinitis: Secondary | ICD-10-CM | POA: Diagnosis not present

## 2020-01-06 DIAGNOSIS — J301 Allergic rhinitis due to pollen: Secondary | ICD-10-CM | POA: Diagnosis not present

## 2020-01-13 DIAGNOSIS — J3089 Other allergic rhinitis: Secondary | ICD-10-CM | POA: Diagnosis not present

## 2020-01-13 DIAGNOSIS — J3081 Allergic rhinitis due to animal (cat) (dog) hair and dander: Secondary | ICD-10-CM | POA: Diagnosis not present

## 2020-01-13 DIAGNOSIS — J301 Allergic rhinitis due to pollen: Secondary | ICD-10-CM | POA: Diagnosis not present

## 2020-01-20 DIAGNOSIS — J3089 Other allergic rhinitis: Secondary | ICD-10-CM | POA: Diagnosis not present

## 2020-01-20 DIAGNOSIS — J301 Allergic rhinitis due to pollen: Secondary | ICD-10-CM | POA: Diagnosis not present

## 2020-01-20 DIAGNOSIS — J3081 Allergic rhinitis due to animal (cat) (dog) hair and dander: Secondary | ICD-10-CM | POA: Diagnosis not present

## 2020-02-04 DIAGNOSIS — Z23 Encounter for immunization: Secondary | ICD-10-CM | POA: Diagnosis not present

## 2020-02-10 DIAGNOSIS — J301 Allergic rhinitis due to pollen: Secondary | ICD-10-CM | POA: Diagnosis not present

## 2020-02-10 DIAGNOSIS — J3081 Allergic rhinitis due to animal (cat) (dog) hair and dander: Secondary | ICD-10-CM | POA: Diagnosis not present

## 2020-02-10 DIAGNOSIS — J3089 Other allergic rhinitis: Secondary | ICD-10-CM | POA: Diagnosis not present

## 2020-02-15 LAB — EXTERNAL GENERIC LAB PROCEDURE: COLOGUARD: NEGATIVE

## 2020-02-15 LAB — COLOGUARD: COLOGUARD: NEGATIVE

## 2020-02-18 DIAGNOSIS — J3089 Other allergic rhinitis: Secondary | ICD-10-CM | POA: Diagnosis not present

## 2020-02-18 DIAGNOSIS — J301 Allergic rhinitis due to pollen: Secondary | ICD-10-CM | POA: Diagnosis not present

## 2020-02-18 DIAGNOSIS — J3081 Allergic rhinitis due to animal (cat) (dog) hair and dander: Secondary | ICD-10-CM | POA: Diagnosis not present

## 2020-02-23 DIAGNOSIS — J3081 Allergic rhinitis due to animal (cat) (dog) hair and dander: Secondary | ICD-10-CM | POA: Diagnosis not present

## 2020-02-23 DIAGNOSIS — J301 Allergic rhinitis due to pollen: Secondary | ICD-10-CM | POA: Diagnosis not present

## 2020-02-23 DIAGNOSIS — J3089 Other allergic rhinitis: Secondary | ICD-10-CM | POA: Diagnosis not present

## 2020-03-02 DIAGNOSIS — J3081 Allergic rhinitis due to animal (cat) (dog) hair and dander: Secondary | ICD-10-CM | POA: Diagnosis not present

## 2020-03-02 DIAGNOSIS — J301 Allergic rhinitis due to pollen: Secondary | ICD-10-CM | POA: Diagnosis not present

## 2020-03-02 DIAGNOSIS — J3089 Other allergic rhinitis: Secondary | ICD-10-CM | POA: Diagnosis not present

## 2020-03-10 DIAGNOSIS — J3081 Allergic rhinitis due to animal (cat) (dog) hair and dander: Secondary | ICD-10-CM | POA: Diagnosis not present

## 2020-03-10 DIAGNOSIS — J301 Allergic rhinitis due to pollen: Secondary | ICD-10-CM | POA: Diagnosis not present

## 2020-03-10 DIAGNOSIS — J3089 Other allergic rhinitis: Secondary | ICD-10-CM | POA: Diagnosis not present

## 2020-03-15 DIAGNOSIS — R319 Hematuria, unspecified: Secondary | ICD-10-CM | POA: Diagnosis not present

## 2020-03-15 DIAGNOSIS — N390 Urinary tract infection, site not specified: Secondary | ICD-10-CM | POA: Diagnosis not present

## 2020-03-15 DIAGNOSIS — R300 Dysuria: Secondary | ICD-10-CM | POA: Diagnosis not present

## 2020-03-16 DIAGNOSIS — J3081 Allergic rhinitis due to animal (cat) (dog) hair and dander: Secondary | ICD-10-CM | POA: Diagnosis not present

## 2020-03-16 DIAGNOSIS — J3089 Other allergic rhinitis: Secondary | ICD-10-CM | POA: Diagnosis not present

## 2020-03-16 DIAGNOSIS — J301 Allergic rhinitis due to pollen: Secondary | ICD-10-CM | POA: Diagnosis not present

## 2020-03-19 DIAGNOSIS — J301 Allergic rhinitis due to pollen: Secondary | ICD-10-CM | POA: Diagnosis not present

## 2020-03-21 DIAGNOSIS — J3089 Other allergic rhinitis: Secondary | ICD-10-CM | POA: Diagnosis not present

## 2020-03-21 DIAGNOSIS — J3081 Allergic rhinitis due to animal (cat) (dog) hair and dander: Secondary | ICD-10-CM | POA: Diagnosis not present

## 2020-03-22 DIAGNOSIS — J301 Allergic rhinitis due to pollen: Secondary | ICD-10-CM | POA: Diagnosis not present

## 2020-03-22 DIAGNOSIS — J3081 Allergic rhinitis due to animal (cat) (dog) hair and dander: Secondary | ICD-10-CM | POA: Diagnosis not present

## 2020-03-22 DIAGNOSIS — J3089 Other allergic rhinitis: Secondary | ICD-10-CM | POA: Diagnosis not present

## 2020-03-29 DIAGNOSIS — J301 Allergic rhinitis due to pollen: Secondary | ICD-10-CM | POA: Diagnosis not present

## 2020-03-29 DIAGNOSIS — J3089 Other allergic rhinitis: Secondary | ICD-10-CM | POA: Diagnosis not present

## 2020-03-29 DIAGNOSIS — J3081 Allergic rhinitis due to animal (cat) (dog) hair and dander: Secondary | ICD-10-CM | POA: Diagnosis not present

## 2020-04-08 DIAGNOSIS — J3081 Allergic rhinitis due to animal (cat) (dog) hair and dander: Secondary | ICD-10-CM | POA: Diagnosis not present

## 2020-04-08 DIAGNOSIS — J301 Allergic rhinitis due to pollen: Secondary | ICD-10-CM | POA: Diagnosis not present

## 2020-04-08 DIAGNOSIS — J3089 Other allergic rhinitis: Secondary | ICD-10-CM | POA: Diagnosis not present

## 2020-04-15 DIAGNOSIS — J301 Allergic rhinitis due to pollen: Secondary | ICD-10-CM | POA: Diagnosis not present

## 2020-04-15 DIAGNOSIS — J3081 Allergic rhinitis due to animal (cat) (dog) hair and dander: Secondary | ICD-10-CM | POA: Diagnosis not present

## 2020-04-15 DIAGNOSIS — J3089 Other allergic rhinitis: Secondary | ICD-10-CM | POA: Diagnosis not present

## 2020-04-20 DIAGNOSIS — J301 Allergic rhinitis due to pollen: Secondary | ICD-10-CM | POA: Diagnosis not present

## 2020-04-20 DIAGNOSIS — J3089 Other allergic rhinitis: Secondary | ICD-10-CM | POA: Diagnosis not present

## 2020-04-20 DIAGNOSIS — J3081 Allergic rhinitis due to animal (cat) (dog) hair and dander: Secondary | ICD-10-CM | POA: Diagnosis not present

## 2020-04-20 DIAGNOSIS — R3121 Asymptomatic microscopic hematuria: Secondary | ICD-10-CM | POA: Diagnosis not present

## 2020-05-04 DIAGNOSIS — J3081 Allergic rhinitis due to animal (cat) (dog) hair and dander: Secondary | ICD-10-CM | POA: Diagnosis not present

## 2020-05-04 DIAGNOSIS — J301 Allergic rhinitis due to pollen: Secondary | ICD-10-CM | POA: Diagnosis not present

## 2020-05-04 DIAGNOSIS — J3089 Other allergic rhinitis: Secondary | ICD-10-CM | POA: Diagnosis not present

## 2020-05-13 DIAGNOSIS — J301 Allergic rhinitis due to pollen: Secondary | ICD-10-CM | POA: Diagnosis not present

## 2020-05-13 DIAGNOSIS — R3121 Asymptomatic microscopic hematuria: Secondary | ICD-10-CM | POA: Diagnosis not present

## 2020-05-13 DIAGNOSIS — N838 Other noninflammatory disorders of ovary, fallopian tube and broad ligament: Secondary | ICD-10-CM | POA: Diagnosis not present

## 2020-05-13 DIAGNOSIS — R3129 Other microscopic hematuria: Secondary | ICD-10-CM | POA: Diagnosis not present

## 2020-05-13 DIAGNOSIS — K769 Liver disease, unspecified: Secondary | ICD-10-CM | POA: Diagnosis not present

## 2020-05-13 DIAGNOSIS — K5730 Diverticulosis of large intestine without perforation or abscess without bleeding: Secondary | ICD-10-CM | POA: Diagnosis not present

## 2020-05-13 DIAGNOSIS — J3089 Other allergic rhinitis: Secondary | ICD-10-CM | POA: Diagnosis not present

## 2020-05-19 DIAGNOSIS — R3121 Asymptomatic microscopic hematuria: Secondary | ICD-10-CM | POA: Diagnosis not present

## 2020-05-26 DIAGNOSIS — J3089 Other allergic rhinitis: Secondary | ICD-10-CM | POA: Diagnosis not present

## 2020-05-26 DIAGNOSIS — J301 Allergic rhinitis due to pollen: Secondary | ICD-10-CM | POA: Diagnosis not present

## 2020-06-17 DIAGNOSIS — R946 Abnormal results of thyroid function studies: Secondary | ICD-10-CM | POA: Diagnosis not present

## 2020-06-17 DIAGNOSIS — R739 Hyperglycemia, unspecified: Secondary | ICD-10-CM | POA: Diagnosis not present

## 2020-06-17 DIAGNOSIS — Z0000 Encounter for general adult medical examination without abnormal findings: Secondary | ICD-10-CM | POA: Diagnosis not present

## 2020-06-20 DIAGNOSIS — H25013 Cortical age-related cataract, bilateral: Secondary | ICD-10-CM | POA: Diagnosis not present

## 2020-06-20 DIAGNOSIS — H2513 Age-related nuclear cataract, bilateral: Secondary | ICD-10-CM | POA: Diagnosis not present

## 2020-06-20 DIAGNOSIS — H35363 Drusen (degenerative) of macula, bilateral: Secondary | ICD-10-CM | POA: Diagnosis not present

## 2020-06-20 DIAGNOSIS — H04123 Dry eye syndrome of bilateral lacrimal glands: Secondary | ICD-10-CM | POA: Diagnosis not present

## 2020-06-20 DIAGNOSIS — H35372 Puckering of macula, left eye: Secondary | ICD-10-CM | POA: Diagnosis not present

## 2020-06-24 DIAGNOSIS — Z0000 Encounter for general adult medical examination without abnormal findings: Secondary | ICD-10-CM | POA: Diagnosis not present

## 2020-07-21 DIAGNOSIS — J3089 Other allergic rhinitis: Secondary | ICD-10-CM | POA: Diagnosis not present

## 2020-07-21 DIAGNOSIS — J3081 Allergic rhinitis due to animal (cat) (dog) hair and dander: Secondary | ICD-10-CM | POA: Diagnosis not present

## 2020-07-21 DIAGNOSIS — J301 Allergic rhinitis due to pollen: Secondary | ICD-10-CM | POA: Diagnosis not present

## 2020-07-28 DIAGNOSIS — J3089 Other allergic rhinitis: Secondary | ICD-10-CM | POA: Diagnosis not present

## 2020-07-28 DIAGNOSIS — J301 Allergic rhinitis due to pollen: Secondary | ICD-10-CM | POA: Diagnosis not present

## 2020-07-28 DIAGNOSIS — J3081 Allergic rhinitis due to animal (cat) (dog) hair and dander: Secondary | ICD-10-CM | POA: Diagnosis not present

## 2020-08-03 DIAGNOSIS — J301 Allergic rhinitis due to pollen: Secondary | ICD-10-CM | POA: Diagnosis not present

## 2020-08-03 DIAGNOSIS — J3089 Other allergic rhinitis: Secondary | ICD-10-CM | POA: Diagnosis not present

## 2020-08-03 DIAGNOSIS — J3081 Allergic rhinitis due to animal (cat) (dog) hair and dander: Secondary | ICD-10-CM | POA: Diagnosis not present

## 2020-08-10 DIAGNOSIS — J3081 Allergic rhinitis due to animal (cat) (dog) hair and dander: Secondary | ICD-10-CM | POA: Diagnosis not present

## 2020-08-10 DIAGNOSIS — J301 Allergic rhinitis due to pollen: Secondary | ICD-10-CM | POA: Diagnosis not present

## 2020-08-10 DIAGNOSIS — J3089 Other allergic rhinitis: Secondary | ICD-10-CM | POA: Diagnosis not present

## 2020-09-09 DIAGNOSIS — J301 Allergic rhinitis due to pollen: Secondary | ICD-10-CM | POA: Diagnosis not present

## 2020-09-09 DIAGNOSIS — J3089 Other allergic rhinitis: Secondary | ICD-10-CM | POA: Diagnosis not present

## 2020-09-09 DIAGNOSIS — J3081 Allergic rhinitis due to animal (cat) (dog) hair and dander: Secondary | ICD-10-CM | POA: Diagnosis not present

## 2020-09-14 DIAGNOSIS — Z713 Dietary counseling and surveillance: Secondary | ICD-10-CM | POA: Diagnosis not present

## 2020-09-21 ENCOUNTER — Other Ambulatory Visit: Payer: Self-pay | Admitting: Obstetrics and Gynecology

## 2020-09-21 DIAGNOSIS — Z1231 Encounter for screening mammogram for malignant neoplasm of breast: Secondary | ICD-10-CM

## 2020-09-23 DIAGNOSIS — J3081 Allergic rhinitis due to animal (cat) (dog) hair and dander: Secondary | ICD-10-CM | POA: Diagnosis not present

## 2020-09-23 DIAGNOSIS — J3089 Other allergic rhinitis: Secondary | ICD-10-CM | POA: Diagnosis not present

## 2020-09-23 DIAGNOSIS — J301 Allergic rhinitis due to pollen: Secondary | ICD-10-CM | POA: Diagnosis not present

## 2020-09-28 DIAGNOSIS — J3081 Allergic rhinitis due to animal (cat) (dog) hair and dander: Secondary | ICD-10-CM | POA: Diagnosis not present

## 2020-09-28 DIAGNOSIS — J3089 Other allergic rhinitis: Secondary | ICD-10-CM | POA: Diagnosis not present

## 2020-09-28 DIAGNOSIS — J301 Allergic rhinitis due to pollen: Secondary | ICD-10-CM | POA: Diagnosis not present

## 2020-10-12 DIAGNOSIS — J3089 Other allergic rhinitis: Secondary | ICD-10-CM | POA: Diagnosis not present

## 2020-10-12 DIAGNOSIS — J301 Allergic rhinitis due to pollen: Secondary | ICD-10-CM | POA: Diagnosis not present

## 2020-10-12 DIAGNOSIS — J3081 Allergic rhinitis due to animal (cat) (dog) hair and dander: Secondary | ICD-10-CM | POA: Diagnosis not present

## 2020-10-21 DIAGNOSIS — J3089 Other allergic rhinitis: Secondary | ICD-10-CM | POA: Diagnosis not present

## 2020-10-21 DIAGNOSIS — J301 Allergic rhinitis due to pollen: Secondary | ICD-10-CM | POA: Diagnosis not present

## 2020-10-21 DIAGNOSIS — J3081 Allergic rhinitis due to animal (cat) (dog) hair and dander: Secondary | ICD-10-CM | POA: Diagnosis not present

## 2020-10-27 DIAGNOSIS — J3089 Other allergic rhinitis: Secondary | ICD-10-CM | POA: Diagnosis not present

## 2020-10-27 DIAGNOSIS — J3081 Allergic rhinitis due to animal (cat) (dog) hair and dander: Secondary | ICD-10-CM | POA: Diagnosis not present

## 2020-10-27 DIAGNOSIS — J301 Allergic rhinitis due to pollen: Secondary | ICD-10-CM | POA: Diagnosis not present

## 2020-11-02 DIAGNOSIS — J3089 Other allergic rhinitis: Secondary | ICD-10-CM | POA: Diagnosis not present

## 2020-11-02 DIAGNOSIS — J3081 Allergic rhinitis due to animal (cat) (dog) hair and dander: Secondary | ICD-10-CM | POA: Diagnosis not present

## 2020-11-02 DIAGNOSIS — J301 Allergic rhinitis due to pollen: Secondary | ICD-10-CM | POA: Diagnosis not present

## 2020-11-09 DIAGNOSIS — J301 Allergic rhinitis due to pollen: Secondary | ICD-10-CM | POA: Diagnosis not present

## 2020-11-09 DIAGNOSIS — J3089 Other allergic rhinitis: Secondary | ICD-10-CM | POA: Diagnosis not present

## 2020-11-09 DIAGNOSIS — J3081 Allergic rhinitis due to animal (cat) (dog) hair and dander: Secondary | ICD-10-CM | POA: Diagnosis not present

## 2020-11-16 DIAGNOSIS — J3089 Other allergic rhinitis: Secondary | ICD-10-CM | POA: Diagnosis not present

## 2020-11-16 DIAGNOSIS — J301 Allergic rhinitis due to pollen: Secondary | ICD-10-CM | POA: Diagnosis not present

## 2020-11-16 DIAGNOSIS — J3081 Allergic rhinitis due to animal (cat) (dog) hair and dander: Secondary | ICD-10-CM | POA: Diagnosis not present

## 2020-11-18 DIAGNOSIS — R3121 Asymptomatic microscopic hematuria: Secondary | ICD-10-CM | POA: Diagnosis not present

## 2020-11-24 DIAGNOSIS — Z23 Encounter for immunization: Secondary | ICD-10-CM | POA: Diagnosis not present

## 2020-11-30 ENCOUNTER — Ambulatory Visit
Admission: RE | Admit: 2020-11-30 | Discharge: 2020-11-30 | Disposition: A | Discharge status: Home / Self Care | Payer: BC Managed Care – PPO | Source: Ambulatory Visit | Attending: Obstetrics and Gynecology | Admitting: Obstetrics and Gynecology

## 2020-11-30 DIAGNOSIS — Z1231 Encounter for screening mammogram for malignant neoplasm of breast: Secondary | ICD-10-CM | POA: Diagnosis not present

## 2020-12-07 DIAGNOSIS — J3089 Other allergic rhinitis: Secondary | ICD-10-CM | POA: Diagnosis not present

## 2020-12-07 DIAGNOSIS — J301 Allergic rhinitis due to pollen: Secondary | ICD-10-CM | POA: Diagnosis not present

## 2020-12-07 DIAGNOSIS — J3081 Allergic rhinitis due to animal (cat) (dog) hair and dander: Secondary | ICD-10-CM | POA: Diagnosis not present

## 2020-12-12 DIAGNOSIS — J3081 Allergic rhinitis due to animal (cat) (dog) hair and dander: Secondary | ICD-10-CM | POA: Diagnosis not present

## 2020-12-12 DIAGNOSIS — J301 Allergic rhinitis due to pollen: Secondary | ICD-10-CM | POA: Diagnosis not present

## 2020-12-12 DIAGNOSIS — J3089 Other allergic rhinitis: Secondary | ICD-10-CM | POA: Diagnosis not present

## 2020-12-16 DIAGNOSIS — J301 Allergic rhinitis due to pollen: Secondary | ICD-10-CM | POA: Diagnosis not present

## 2020-12-16 DIAGNOSIS — J3089 Other allergic rhinitis: Secondary | ICD-10-CM | POA: Diagnosis not present

## 2020-12-16 DIAGNOSIS — J3081 Allergic rhinitis due to animal (cat) (dog) hair and dander: Secondary | ICD-10-CM | POA: Diagnosis not present

## 2020-12-21 DIAGNOSIS — J3081 Allergic rhinitis due to animal (cat) (dog) hair and dander: Secondary | ICD-10-CM | POA: Diagnosis not present

## 2020-12-21 DIAGNOSIS — J301 Allergic rhinitis due to pollen: Secondary | ICD-10-CM | POA: Diagnosis not present

## 2020-12-21 DIAGNOSIS — J3089 Other allergic rhinitis: Secondary | ICD-10-CM | POA: Diagnosis not present

## 2020-12-22 DIAGNOSIS — Z01419 Encounter for gynecological examination (general) (routine) without abnormal findings: Secondary | ICD-10-CM | POA: Diagnosis not present

## 2020-12-22 DIAGNOSIS — Z6826 Body mass index (BMI) 26.0-26.9, adult: Secondary | ICD-10-CM | POA: Diagnosis not present

## 2021-01-25 DIAGNOSIS — J3081 Allergic rhinitis due to animal (cat) (dog) hair and dander: Secondary | ICD-10-CM | POA: Diagnosis not present

## 2021-01-25 DIAGNOSIS — J3089 Other allergic rhinitis: Secondary | ICD-10-CM | POA: Diagnosis not present

## 2021-01-25 DIAGNOSIS — J301 Allergic rhinitis due to pollen: Secondary | ICD-10-CM | POA: Diagnosis not present

## 2021-02-07 DIAGNOSIS — Z713 Dietary counseling and surveillance: Secondary | ICD-10-CM | POA: Diagnosis not present

## 2021-03-01 DIAGNOSIS — J3089 Other allergic rhinitis: Secondary | ICD-10-CM | POA: Diagnosis not present

## 2021-03-01 DIAGNOSIS — J301 Allergic rhinitis due to pollen: Secondary | ICD-10-CM | POA: Diagnosis not present

## 2021-03-01 DIAGNOSIS — J3081 Allergic rhinitis due to animal (cat) (dog) hair and dander: Secondary | ICD-10-CM | POA: Diagnosis not present

## 2021-03-08 DIAGNOSIS — J301 Allergic rhinitis due to pollen: Secondary | ICD-10-CM | POA: Diagnosis not present

## 2021-03-08 DIAGNOSIS — J3081 Allergic rhinitis due to animal (cat) (dog) hair and dander: Secondary | ICD-10-CM | POA: Diagnosis not present

## 2021-03-08 DIAGNOSIS — J3089 Other allergic rhinitis: Secondary | ICD-10-CM | POA: Diagnosis not present

## 2021-03-15 DIAGNOSIS — J301 Allergic rhinitis due to pollen: Secondary | ICD-10-CM | POA: Diagnosis not present

## 2021-03-15 DIAGNOSIS — J3089 Other allergic rhinitis: Secondary | ICD-10-CM | POA: Diagnosis not present

## 2021-03-15 DIAGNOSIS — J3081 Allergic rhinitis due to animal (cat) (dog) hair and dander: Secondary | ICD-10-CM | POA: Diagnosis not present

## 2021-03-21 DIAGNOSIS — J301 Allergic rhinitis due to pollen: Secondary | ICD-10-CM | POA: Diagnosis not present

## 2021-03-22 DIAGNOSIS — J3081 Allergic rhinitis due to animal (cat) (dog) hair and dander: Secondary | ICD-10-CM | POA: Diagnosis not present

## 2021-03-22 DIAGNOSIS — J3089 Other allergic rhinitis: Secondary | ICD-10-CM | POA: Diagnosis not present

## 2021-03-22 DIAGNOSIS — J301 Allergic rhinitis due to pollen: Secondary | ICD-10-CM | POA: Diagnosis not present

## 2021-04-10 DIAGNOSIS — M7711 Lateral epicondylitis, right elbow: Secondary | ICD-10-CM | POA: Diagnosis not present

## 2021-04-10 DIAGNOSIS — M25521 Pain in right elbow: Secondary | ICD-10-CM | POA: Diagnosis not present

## 2021-04-27 DIAGNOSIS — J301 Allergic rhinitis due to pollen: Secondary | ICD-10-CM | POA: Diagnosis not present

## 2021-04-27 DIAGNOSIS — J3081 Allergic rhinitis due to animal (cat) (dog) hair and dander: Secondary | ICD-10-CM | POA: Diagnosis not present

## 2021-04-27 DIAGNOSIS — J3089 Other allergic rhinitis: Secondary | ICD-10-CM | POA: Diagnosis not present

## 2021-05-01 DIAGNOSIS — M7711 Lateral epicondylitis, right elbow: Secondary | ICD-10-CM | POA: Diagnosis not present

## 2021-05-01 DIAGNOSIS — M25521 Pain in right elbow: Secondary | ICD-10-CM | POA: Diagnosis not present

## 2021-05-03 DIAGNOSIS — J301 Allergic rhinitis due to pollen: Secondary | ICD-10-CM | POA: Diagnosis not present

## 2021-05-03 DIAGNOSIS — J3081 Allergic rhinitis due to animal (cat) (dog) hair and dander: Secondary | ICD-10-CM | POA: Diagnosis not present

## 2021-05-03 DIAGNOSIS — J3089 Other allergic rhinitis: Secondary | ICD-10-CM | POA: Diagnosis not present

## 2021-05-10 DIAGNOSIS — J3081 Allergic rhinitis due to animal (cat) (dog) hair and dander: Secondary | ICD-10-CM | POA: Diagnosis not present

## 2021-05-10 DIAGNOSIS — J3089 Other allergic rhinitis: Secondary | ICD-10-CM | POA: Diagnosis not present

## 2021-05-10 DIAGNOSIS — J301 Allergic rhinitis due to pollen: Secondary | ICD-10-CM | POA: Diagnosis not present

## 2021-05-11 DIAGNOSIS — M7711 Lateral epicondylitis, right elbow: Secondary | ICD-10-CM | POA: Diagnosis not present

## 2021-05-17 DIAGNOSIS — J301 Allergic rhinitis due to pollen: Secondary | ICD-10-CM | POA: Diagnosis not present

## 2021-05-17 DIAGNOSIS — J3089 Other allergic rhinitis: Secondary | ICD-10-CM | POA: Diagnosis not present

## 2021-05-17 DIAGNOSIS — J3081 Allergic rhinitis due to animal (cat) (dog) hair and dander: Secondary | ICD-10-CM | POA: Diagnosis not present

## 2021-05-18 DIAGNOSIS — M25621 Stiffness of right elbow, not elsewhere classified: Secondary | ICD-10-CM | POA: Diagnosis not present

## 2021-05-18 DIAGNOSIS — M7711 Lateral epicondylitis, right elbow: Secondary | ICD-10-CM | POA: Diagnosis not present

## 2021-05-18 DIAGNOSIS — M25521 Pain in right elbow: Secondary | ICD-10-CM | POA: Diagnosis not present

## 2021-05-18 DIAGNOSIS — R531 Weakness: Secondary | ICD-10-CM | POA: Diagnosis not present

## 2021-05-19 DIAGNOSIS — R3121 Asymptomatic microscopic hematuria: Secondary | ICD-10-CM | POA: Diagnosis not present

## 2021-05-23 DIAGNOSIS — M25521 Pain in right elbow: Secondary | ICD-10-CM | POA: Diagnosis not present

## 2021-05-23 DIAGNOSIS — M7711 Lateral epicondylitis, right elbow: Secondary | ICD-10-CM | POA: Diagnosis not present

## 2021-05-23 DIAGNOSIS — M25621 Stiffness of right elbow, not elsewhere classified: Secondary | ICD-10-CM | POA: Diagnosis not present

## 2021-05-23 DIAGNOSIS — R531 Weakness: Secondary | ICD-10-CM | POA: Diagnosis not present

## 2021-05-24 DIAGNOSIS — J3081 Allergic rhinitis due to animal (cat) (dog) hair and dander: Secondary | ICD-10-CM | POA: Diagnosis not present

## 2021-05-24 DIAGNOSIS — J3089 Other allergic rhinitis: Secondary | ICD-10-CM | POA: Diagnosis not present

## 2021-05-24 DIAGNOSIS — J301 Allergic rhinitis due to pollen: Secondary | ICD-10-CM | POA: Diagnosis not present

## 2021-05-26 DIAGNOSIS — R531 Weakness: Secondary | ICD-10-CM | POA: Diagnosis not present

## 2021-05-26 DIAGNOSIS — M25521 Pain in right elbow: Secondary | ICD-10-CM | POA: Diagnosis not present

## 2021-05-26 DIAGNOSIS — M25621 Stiffness of right elbow, not elsewhere classified: Secondary | ICD-10-CM | POA: Diagnosis not present

## 2021-05-26 DIAGNOSIS — M7711 Lateral epicondylitis, right elbow: Secondary | ICD-10-CM | POA: Diagnosis not present

## 2021-05-29 DIAGNOSIS — R531 Weakness: Secondary | ICD-10-CM | POA: Diagnosis not present

## 2021-05-29 DIAGNOSIS — M25621 Stiffness of right elbow, not elsewhere classified: Secondary | ICD-10-CM | POA: Diagnosis not present

## 2021-05-29 DIAGNOSIS — M25521 Pain in right elbow: Secondary | ICD-10-CM | POA: Diagnosis not present

## 2021-05-29 DIAGNOSIS — M7711 Lateral epicondylitis, right elbow: Secondary | ICD-10-CM | POA: Diagnosis not present

## 2021-05-31 DIAGNOSIS — M25521 Pain in right elbow: Secondary | ICD-10-CM | POA: Diagnosis not present

## 2021-05-31 DIAGNOSIS — R531 Weakness: Secondary | ICD-10-CM | POA: Diagnosis not present

## 2021-05-31 DIAGNOSIS — M7711 Lateral epicondylitis, right elbow: Secondary | ICD-10-CM | POA: Diagnosis not present

## 2021-05-31 DIAGNOSIS — M25621 Stiffness of right elbow, not elsewhere classified: Secondary | ICD-10-CM | POA: Diagnosis not present

## 2021-06-01 DIAGNOSIS — J3089 Other allergic rhinitis: Secondary | ICD-10-CM | POA: Diagnosis not present

## 2021-06-01 DIAGNOSIS — J301 Allergic rhinitis due to pollen: Secondary | ICD-10-CM | POA: Diagnosis not present

## 2021-06-01 DIAGNOSIS — J3081 Allergic rhinitis due to animal (cat) (dog) hair and dander: Secondary | ICD-10-CM | POA: Diagnosis not present

## 2021-06-06 DIAGNOSIS — M25621 Stiffness of right elbow, not elsewhere classified: Secondary | ICD-10-CM | POA: Diagnosis not present

## 2021-06-06 DIAGNOSIS — M7711 Lateral epicondylitis, right elbow: Secondary | ICD-10-CM | POA: Diagnosis not present

## 2021-06-06 DIAGNOSIS — R531 Weakness: Secondary | ICD-10-CM | POA: Diagnosis not present

## 2021-06-06 DIAGNOSIS — M25521 Pain in right elbow: Secondary | ICD-10-CM | POA: Diagnosis not present

## 2021-06-09 DIAGNOSIS — M25621 Stiffness of right elbow, not elsewhere classified: Secondary | ICD-10-CM | POA: Diagnosis not present

## 2021-06-09 DIAGNOSIS — R531 Weakness: Secondary | ICD-10-CM | POA: Diagnosis not present

## 2021-06-09 DIAGNOSIS — M7711 Lateral epicondylitis, right elbow: Secondary | ICD-10-CM | POA: Diagnosis not present

## 2021-06-09 DIAGNOSIS — M25521 Pain in right elbow: Secondary | ICD-10-CM | POA: Diagnosis not present

## 2021-06-12 DIAGNOSIS — R531 Weakness: Secondary | ICD-10-CM | POA: Diagnosis not present

## 2021-06-12 DIAGNOSIS — M7711 Lateral epicondylitis, right elbow: Secondary | ICD-10-CM | POA: Diagnosis not present

## 2021-06-12 DIAGNOSIS — M25621 Stiffness of right elbow, not elsewhere classified: Secondary | ICD-10-CM | POA: Diagnosis not present

## 2021-06-12 DIAGNOSIS — M25521 Pain in right elbow: Secondary | ICD-10-CM | POA: Diagnosis not present

## 2021-06-19 DIAGNOSIS — M25621 Stiffness of right elbow, not elsewhere classified: Secondary | ICD-10-CM | POA: Diagnosis not present

## 2021-06-19 DIAGNOSIS — R531 Weakness: Secondary | ICD-10-CM | POA: Diagnosis not present

## 2021-06-19 DIAGNOSIS — M25521 Pain in right elbow: Secondary | ICD-10-CM | POA: Diagnosis not present

## 2021-06-19 DIAGNOSIS — M7711 Lateral epicondylitis, right elbow: Secondary | ICD-10-CM | POA: Diagnosis not present

## 2021-06-27 DIAGNOSIS — N83209 Unspecified ovarian cyst, unspecified side: Secondary | ICD-10-CM | POA: Diagnosis not present

## 2021-07-04 DIAGNOSIS — Z0000 Encounter for general adult medical examination without abnormal findings: Secondary | ICD-10-CM | POA: Diagnosis not present

## 2021-07-04 DIAGNOSIS — R946 Abnormal results of thyroid function studies: Secondary | ICD-10-CM | POA: Diagnosis not present

## 2021-07-04 DIAGNOSIS — W57XXXA Bitten or stung by nonvenomous insect and other nonvenomous arthropods, initial encounter: Secondary | ICD-10-CM | POA: Diagnosis not present

## 2021-07-04 DIAGNOSIS — L237 Allergic contact dermatitis due to plants, except food: Secondary | ICD-10-CM | POA: Diagnosis not present

## 2021-07-04 DIAGNOSIS — S30860A Insect bite (nonvenomous) of lower back and pelvis, initial encounter: Secondary | ICD-10-CM | POA: Diagnosis not present

## 2021-07-04 DIAGNOSIS — R739 Hyperglycemia, unspecified: Secondary | ICD-10-CM | POA: Diagnosis not present

## 2021-07-14 DIAGNOSIS — Z0000 Encounter for general adult medical examination without abnormal findings: Secondary | ICD-10-CM | POA: Diagnosis not present

## 2021-08-14 DIAGNOSIS — H524 Presbyopia: Secondary | ICD-10-CM | POA: Diagnosis not present

## 2021-08-14 DIAGNOSIS — H25813 Combined forms of age-related cataract, bilateral: Secondary | ICD-10-CM | POA: Diagnosis not present

## 2021-08-14 DIAGNOSIS — H04123 Dry eye syndrome of bilateral lacrimal glands: Secondary | ICD-10-CM | POA: Diagnosis not present

## 2021-09-22 DIAGNOSIS — J3089 Other allergic rhinitis: Secondary | ICD-10-CM | POA: Diagnosis not present

## 2021-09-22 DIAGNOSIS — J3081 Allergic rhinitis due to animal (cat) (dog) hair and dander: Secondary | ICD-10-CM | POA: Diagnosis not present

## 2021-09-22 DIAGNOSIS — J301 Allergic rhinitis due to pollen: Secondary | ICD-10-CM | POA: Diagnosis not present

## 2021-09-28 DIAGNOSIS — J3089 Other allergic rhinitis: Secondary | ICD-10-CM | POA: Diagnosis not present

## 2021-09-28 DIAGNOSIS — J301 Allergic rhinitis due to pollen: Secondary | ICD-10-CM | POA: Diagnosis not present

## 2021-09-28 DIAGNOSIS — J3081 Allergic rhinitis due to animal (cat) (dog) hair and dander: Secondary | ICD-10-CM | POA: Diagnosis not present

## 2021-10-05 DIAGNOSIS — J301 Allergic rhinitis due to pollen: Secondary | ICD-10-CM | POA: Diagnosis not present

## 2021-10-05 DIAGNOSIS — J3089 Other allergic rhinitis: Secondary | ICD-10-CM | POA: Diagnosis not present

## 2021-10-05 DIAGNOSIS — J3081 Allergic rhinitis due to animal (cat) (dog) hair and dander: Secondary | ICD-10-CM | POA: Diagnosis not present

## 2021-10-12 DIAGNOSIS — J3089 Other allergic rhinitis: Secondary | ICD-10-CM | POA: Diagnosis not present

## 2021-10-12 DIAGNOSIS — J301 Allergic rhinitis due to pollen: Secondary | ICD-10-CM | POA: Diagnosis not present

## 2021-10-19 DIAGNOSIS — J3089 Other allergic rhinitis: Secondary | ICD-10-CM | POA: Diagnosis not present

## 2021-10-19 DIAGNOSIS — J301 Allergic rhinitis due to pollen: Secondary | ICD-10-CM | POA: Diagnosis not present

## 2021-10-19 DIAGNOSIS — J3081 Allergic rhinitis due to animal (cat) (dog) hair and dander: Secondary | ICD-10-CM | POA: Diagnosis not present

## 2021-10-25 DIAGNOSIS — J3081 Allergic rhinitis due to animal (cat) (dog) hair and dander: Secondary | ICD-10-CM | POA: Diagnosis not present

## 2021-10-25 DIAGNOSIS — J3089 Other allergic rhinitis: Secondary | ICD-10-CM | POA: Diagnosis not present

## 2021-10-25 DIAGNOSIS — J301 Allergic rhinitis due to pollen: Secondary | ICD-10-CM | POA: Diagnosis not present

## 2021-10-31 ENCOUNTER — Other Ambulatory Visit: Payer: Self-pay | Admitting: Obstetrics and Gynecology

## 2021-10-31 DIAGNOSIS — Z1231 Encounter for screening mammogram for malignant neoplasm of breast: Secondary | ICD-10-CM

## 2021-11-01 DIAGNOSIS — J301 Allergic rhinitis due to pollen: Secondary | ICD-10-CM | POA: Diagnosis not present

## 2021-11-01 DIAGNOSIS — J3081 Allergic rhinitis due to animal (cat) (dog) hair and dander: Secondary | ICD-10-CM | POA: Diagnosis not present

## 2021-11-01 DIAGNOSIS — J3089 Other allergic rhinitis: Secondary | ICD-10-CM | POA: Diagnosis not present

## 2021-11-02 DIAGNOSIS — H6120 Impacted cerumen, unspecified ear: Secondary | ICD-10-CM | POA: Diagnosis not present

## 2021-11-09 DIAGNOSIS — J3081 Allergic rhinitis due to animal (cat) (dog) hair and dander: Secondary | ICD-10-CM | POA: Diagnosis not present

## 2021-11-09 DIAGNOSIS — J3089 Other allergic rhinitis: Secondary | ICD-10-CM | POA: Diagnosis not present

## 2021-11-09 DIAGNOSIS — J301 Allergic rhinitis due to pollen: Secondary | ICD-10-CM | POA: Diagnosis not present

## 2021-11-16 DIAGNOSIS — J3089 Other allergic rhinitis: Secondary | ICD-10-CM | POA: Diagnosis not present

## 2021-11-16 DIAGNOSIS — J3081 Allergic rhinitis due to animal (cat) (dog) hair and dander: Secondary | ICD-10-CM | POA: Diagnosis not present

## 2021-11-16 DIAGNOSIS — J301 Allergic rhinitis due to pollen: Secondary | ICD-10-CM | POA: Diagnosis not present

## 2021-11-22 DIAGNOSIS — J301 Allergic rhinitis due to pollen: Secondary | ICD-10-CM | POA: Diagnosis not present

## 2021-11-22 DIAGNOSIS — J3089 Other allergic rhinitis: Secondary | ICD-10-CM | POA: Diagnosis not present

## 2021-11-22 DIAGNOSIS — J3081 Allergic rhinitis due to animal (cat) (dog) hair and dander: Secondary | ICD-10-CM | POA: Diagnosis not present

## 2021-11-23 DIAGNOSIS — R3129 Other microscopic hematuria: Secondary | ICD-10-CM | POA: Diagnosis not present

## 2021-11-23 DIAGNOSIS — R3121 Asymptomatic microscopic hematuria: Secondary | ICD-10-CM | POA: Diagnosis not present

## 2021-12-12 DIAGNOSIS — J3081 Allergic rhinitis due to animal (cat) (dog) hair and dander: Secondary | ICD-10-CM | POA: Diagnosis not present

## 2021-12-12 DIAGNOSIS — J3089 Other allergic rhinitis: Secondary | ICD-10-CM | POA: Diagnosis not present

## 2021-12-12 DIAGNOSIS — J301 Allergic rhinitis due to pollen: Secondary | ICD-10-CM | POA: Diagnosis not present

## 2021-12-21 DIAGNOSIS — J3089 Other allergic rhinitis: Secondary | ICD-10-CM | POA: Diagnosis not present

## 2021-12-21 DIAGNOSIS — J301 Allergic rhinitis due to pollen: Secondary | ICD-10-CM | POA: Diagnosis not present

## 2021-12-21 DIAGNOSIS — J3081 Allergic rhinitis due to animal (cat) (dog) hair and dander: Secondary | ICD-10-CM | POA: Diagnosis not present

## 2021-12-29 ENCOUNTER — Ambulatory Visit
Admission: RE | Admit: 2021-12-29 | Discharge: 2021-12-29 | Disposition: A | Discharge status: Home / Self Care | Payer: BC Managed Care – PPO | Source: Ambulatory Visit | Attending: Obstetrics and Gynecology | Admitting: Obstetrics and Gynecology

## 2021-12-29 DIAGNOSIS — J301 Allergic rhinitis due to pollen: Secondary | ICD-10-CM | POA: Diagnosis not present

## 2021-12-29 DIAGNOSIS — Z1231 Encounter for screening mammogram for malignant neoplasm of breast: Secondary | ICD-10-CM | POA: Diagnosis not present

## 2021-12-29 DIAGNOSIS — J3081 Allergic rhinitis due to animal (cat) (dog) hair and dander: Secondary | ICD-10-CM | POA: Diagnosis not present

## 2021-12-29 DIAGNOSIS — J3089 Other allergic rhinitis: Secondary | ICD-10-CM | POA: Diagnosis not present

## 2022-01-17 DIAGNOSIS — J3081 Allergic rhinitis due to animal (cat) (dog) hair and dander: Secondary | ICD-10-CM | POA: Diagnosis not present

## 2022-01-17 DIAGNOSIS — J301 Allergic rhinitis due to pollen: Secondary | ICD-10-CM | POA: Diagnosis not present

## 2022-01-17 DIAGNOSIS — J3089 Other allergic rhinitis: Secondary | ICD-10-CM | POA: Diagnosis not present

## 2022-01-24 DIAGNOSIS — M7711 Lateral epicondylitis, right elbow: Secondary | ICD-10-CM | POA: Diagnosis not present

## 2022-01-24 DIAGNOSIS — M25521 Pain in right elbow: Secondary | ICD-10-CM | POA: Diagnosis not present

## 2022-02-01 DIAGNOSIS — J3089 Other allergic rhinitis: Secondary | ICD-10-CM | POA: Diagnosis not present

## 2022-02-01 DIAGNOSIS — J301 Allergic rhinitis due to pollen: Secondary | ICD-10-CM | POA: Diagnosis not present

## 2022-02-01 DIAGNOSIS — J3081 Allergic rhinitis due to animal (cat) (dog) hair and dander: Secondary | ICD-10-CM | POA: Diagnosis not present

## 2022-02-05 DIAGNOSIS — M6281 Muscle weakness (generalized): Secondary | ICD-10-CM | POA: Diagnosis not present

## 2022-02-05 DIAGNOSIS — M25521 Pain in right elbow: Secondary | ICD-10-CM | POA: Diagnosis not present

## 2022-02-05 DIAGNOSIS — M25621 Stiffness of right elbow, not elsewhere classified: Secondary | ICD-10-CM | POA: Diagnosis not present

## 2022-02-07 DIAGNOSIS — M25521 Pain in right elbow: Secondary | ICD-10-CM | POA: Diagnosis not present

## 2022-02-07 DIAGNOSIS — M25621 Stiffness of right elbow, not elsewhere classified: Secondary | ICD-10-CM | POA: Diagnosis not present

## 2022-02-07 DIAGNOSIS — M6281 Muscle weakness (generalized): Secondary | ICD-10-CM | POA: Diagnosis not present

## 2022-02-08 DIAGNOSIS — D279 Benign neoplasm of unspecified ovary: Secondary | ICD-10-CM | POA: Diagnosis not present

## 2022-02-13 DIAGNOSIS — M25621 Stiffness of right elbow, not elsewhere classified: Secondary | ICD-10-CM | POA: Diagnosis not present

## 2022-02-13 DIAGNOSIS — M6281 Muscle weakness (generalized): Secondary | ICD-10-CM | POA: Diagnosis not present

## 2022-02-13 DIAGNOSIS — M25521 Pain in right elbow: Secondary | ICD-10-CM | POA: Diagnosis not present

## 2022-02-14 DIAGNOSIS — J3089 Other allergic rhinitis: Secondary | ICD-10-CM | POA: Diagnosis not present

## 2022-02-14 DIAGNOSIS — J301 Allergic rhinitis due to pollen: Secondary | ICD-10-CM | POA: Diagnosis not present

## 2022-02-14 DIAGNOSIS — J3081 Allergic rhinitis due to animal (cat) (dog) hair and dander: Secondary | ICD-10-CM | POA: Diagnosis not present

## 2022-02-15 DIAGNOSIS — M6281 Muscle weakness (generalized): Secondary | ICD-10-CM | POA: Diagnosis not present

## 2022-02-15 DIAGNOSIS — M25621 Stiffness of right elbow, not elsewhere classified: Secondary | ICD-10-CM | POA: Diagnosis not present

## 2022-02-15 DIAGNOSIS — M25521 Pain in right elbow: Secondary | ICD-10-CM | POA: Diagnosis not present

## 2022-02-19 DIAGNOSIS — M25521 Pain in right elbow: Secondary | ICD-10-CM | POA: Diagnosis not present

## 2022-02-19 DIAGNOSIS — M25621 Stiffness of right elbow, not elsewhere classified: Secondary | ICD-10-CM | POA: Diagnosis not present

## 2022-02-19 DIAGNOSIS — M6281 Muscle weakness (generalized): Secondary | ICD-10-CM | POA: Diagnosis not present

## 2022-02-22 DIAGNOSIS — M6281 Muscle weakness (generalized): Secondary | ICD-10-CM | POA: Diagnosis not present

## 2022-02-22 DIAGNOSIS — M25521 Pain in right elbow: Secondary | ICD-10-CM | POA: Diagnosis not present

## 2022-02-22 DIAGNOSIS — M25621 Stiffness of right elbow, not elsewhere classified: Secondary | ICD-10-CM | POA: Diagnosis not present

## 2022-02-26 DIAGNOSIS — M6281 Muscle weakness (generalized): Secondary | ICD-10-CM | POA: Diagnosis not present

## 2022-02-26 DIAGNOSIS — M25521 Pain in right elbow: Secondary | ICD-10-CM | POA: Diagnosis not present

## 2022-02-26 DIAGNOSIS — M25621 Stiffness of right elbow, not elsewhere classified: Secondary | ICD-10-CM | POA: Diagnosis not present

## 2022-02-28 DIAGNOSIS — M25621 Stiffness of right elbow, not elsewhere classified: Secondary | ICD-10-CM | POA: Diagnosis not present

## 2022-02-28 DIAGNOSIS — M25521 Pain in right elbow: Secondary | ICD-10-CM | POA: Diagnosis not present

## 2022-02-28 DIAGNOSIS — M6281 Muscle weakness (generalized): Secondary | ICD-10-CM | POA: Diagnosis not present

## 2022-02-28 IMAGING — MG DIGITAL SCREENING BILAT W/ TOMO W/ CAD
8 series · 9 of 24 positions shown · non-contrast
Comparison: Previous exam(s).

CLINICAL DATA: Screening.

EXAM:
DIGITAL SCREENING BILATERAL MAMMOGRAM WITH TOMO AND CAD

[R CC synth-2D]
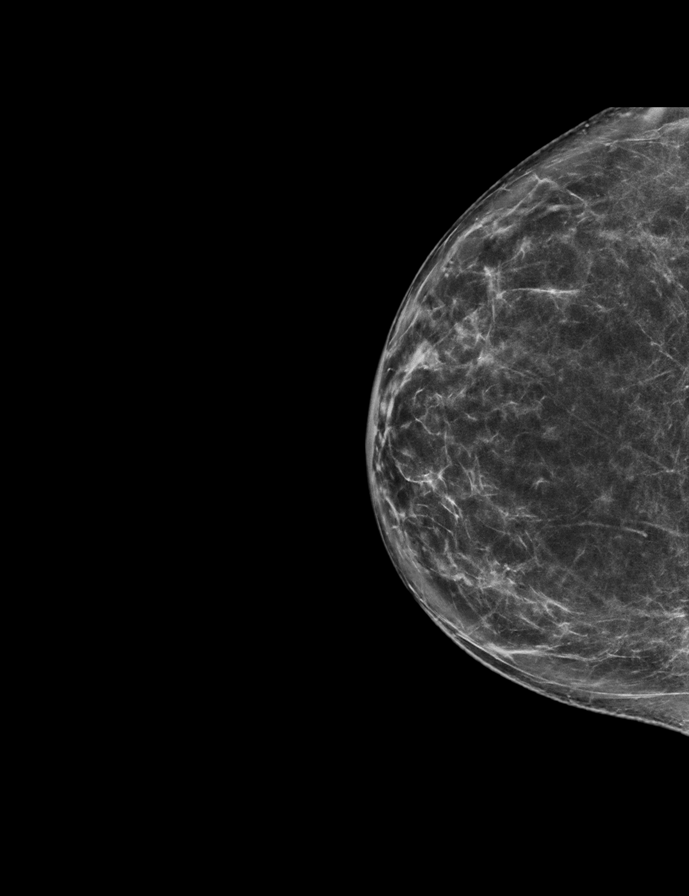

[L MLO synth-2D]
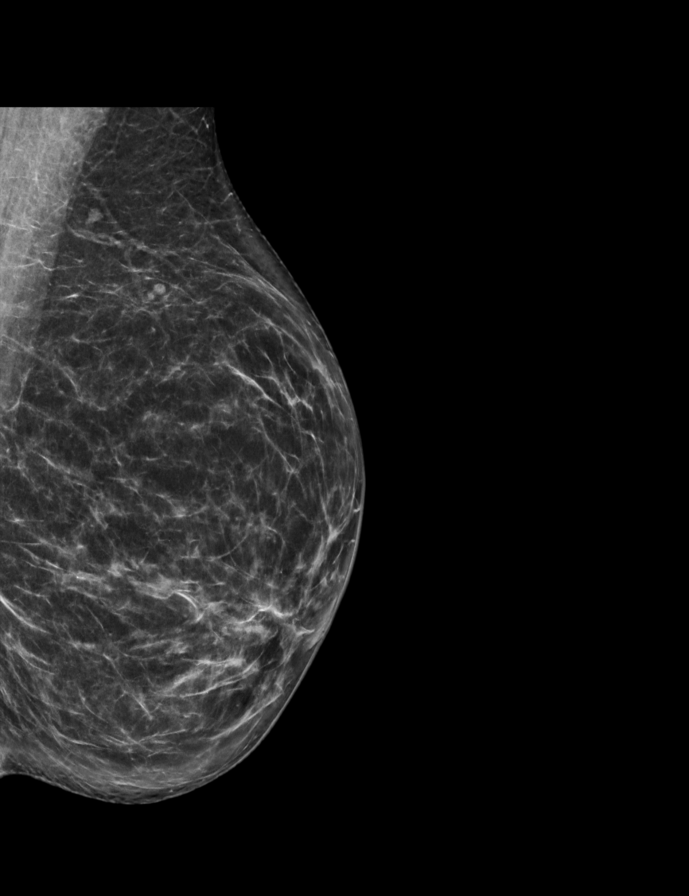

[L CC synth-2D]
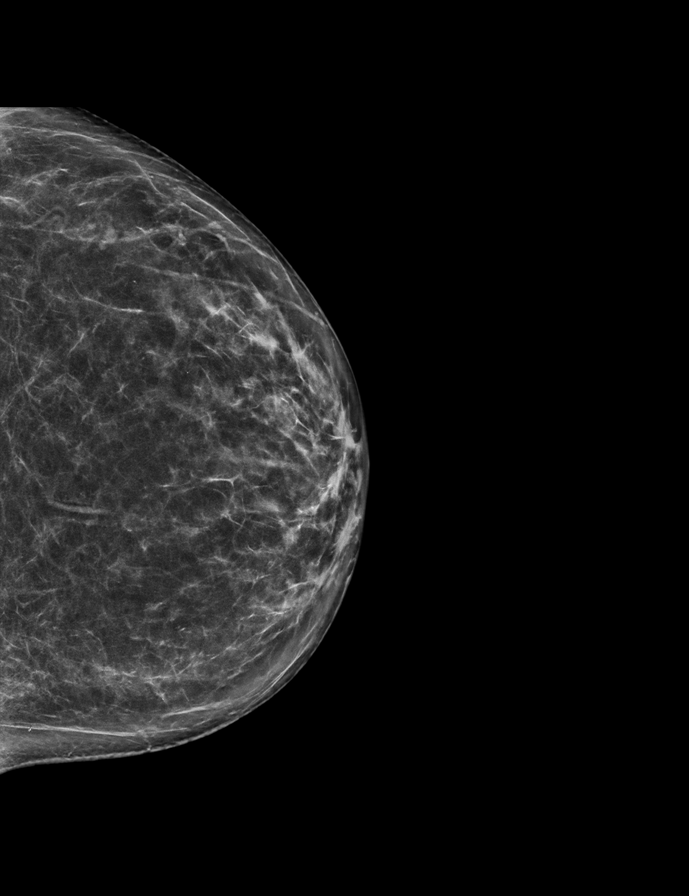

[R MLO synth-2D]
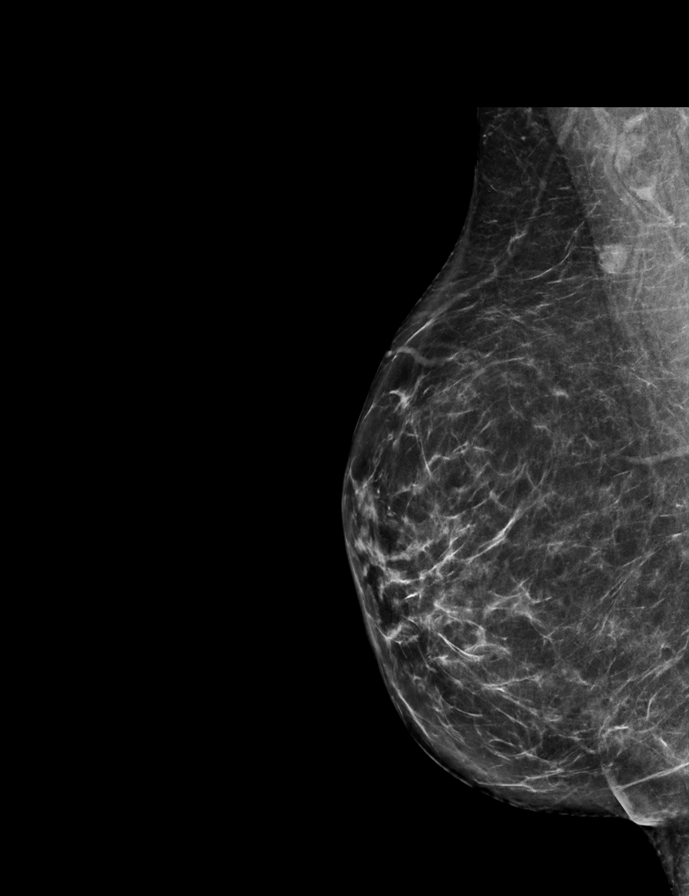

[L MLO tomo · 2 of 71 frames shown]
[frame 23/71]
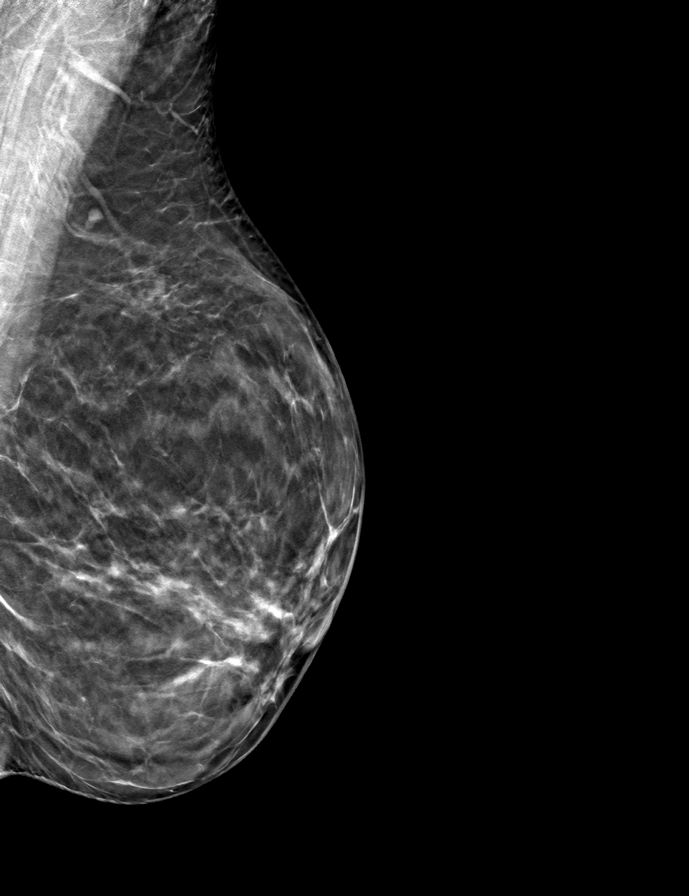
[frame 36/71]
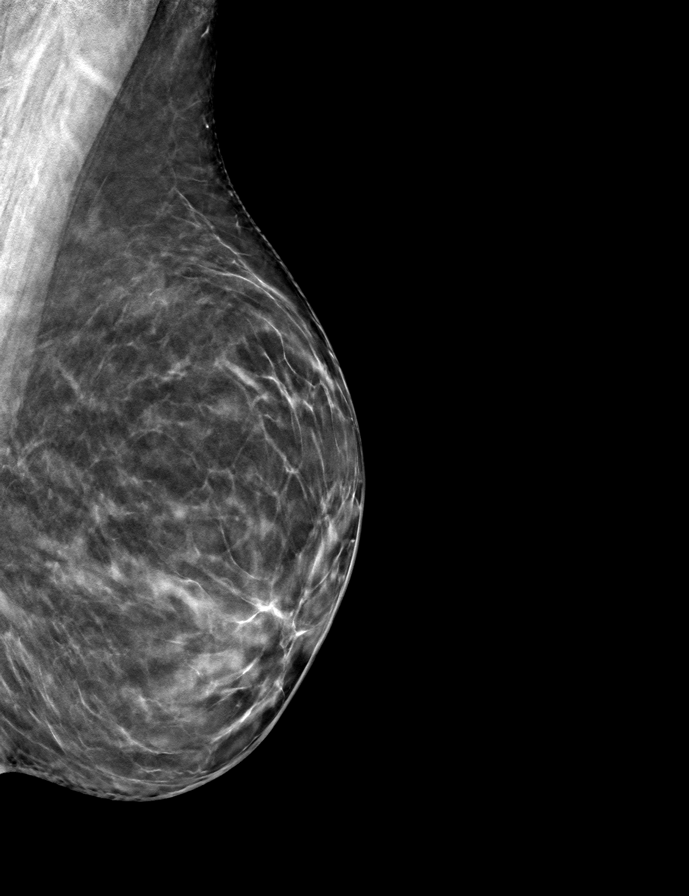

[R CC tomo · tomo slice 37/73.0]
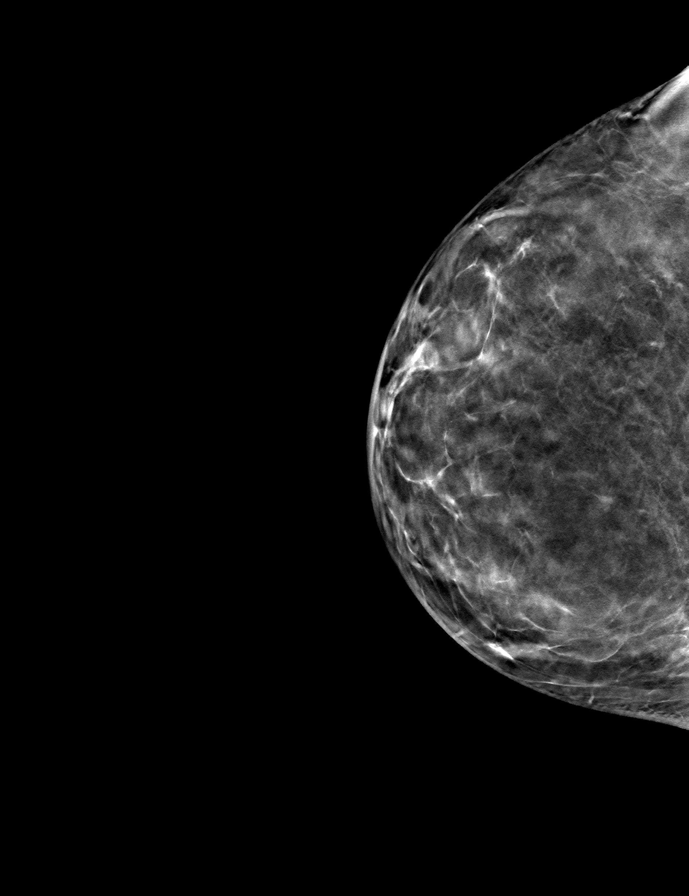

[R MLO tomo · tomo slice 39/76.0]
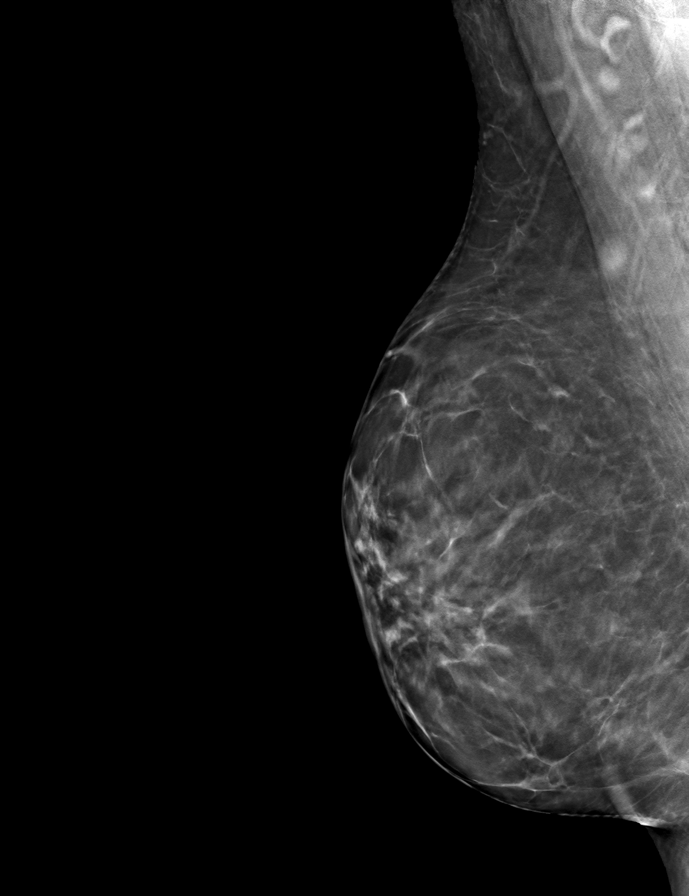

[L CC tomo · tomo slice 35/70.0]
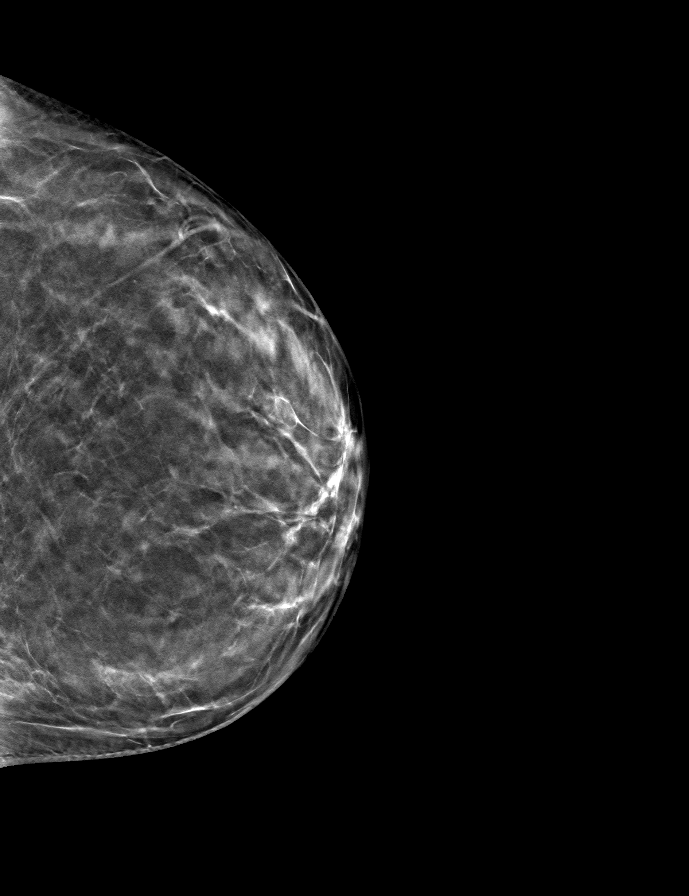

[9 of 24 positions shown; findings below may reference images not displayed]

ACR Breast Density Category b: There are scattered areas of
fibroglandular density.
FINDINGS: There are no findings suspicious for malignancy. Images were
processed with CAD.
IMPRESSION: No mammographic evidence of malignancy. A result letter of this
screening mammogram will be mailed directly to the patient.

RECOMMENDATION:
Screening mammogram in one year. (Code:[id number])

BI-RADS CATEGORY  1: Negative.

## 2022-03-01 DIAGNOSIS — J3081 Allergic rhinitis due to animal (cat) (dog) hair and dander: Secondary | ICD-10-CM | POA: Diagnosis not present

## 2022-03-01 DIAGNOSIS — J301 Allergic rhinitis due to pollen: Secondary | ICD-10-CM | POA: Diagnosis not present

## 2022-03-01 DIAGNOSIS — J3089 Other allergic rhinitis: Secondary | ICD-10-CM | POA: Diagnosis not present

## 2022-03-06 DIAGNOSIS — M6281 Muscle weakness (generalized): Secondary | ICD-10-CM | POA: Diagnosis not present

## 2022-03-06 DIAGNOSIS — M25521 Pain in right elbow: Secondary | ICD-10-CM | POA: Diagnosis not present

## 2022-03-06 DIAGNOSIS — M25621 Stiffness of right elbow, not elsewhere classified: Secondary | ICD-10-CM | POA: Diagnosis not present

## 2022-03-08 DIAGNOSIS — J301 Allergic rhinitis due to pollen: Secondary | ICD-10-CM | POA: Diagnosis not present

## 2022-03-08 DIAGNOSIS — M25521 Pain in right elbow: Secondary | ICD-10-CM | POA: Diagnosis not present

## 2022-03-08 DIAGNOSIS — J3081 Allergic rhinitis due to animal (cat) (dog) hair and dander: Secondary | ICD-10-CM | POA: Diagnosis not present

## 2022-03-08 DIAGNOSIS — M25621 Stiffness of right elbow, not elsewhere classified: Secondary | ICD-10-CM | POA: Diagnosis not present

## 2022-03-08 DIAGNOSIS — J3089 Other allergic rhinitis: Secondary | ICD-10-CM | POA: Diagnosis not present

## 2022-03-08 DIAGNOSIS — M6281 Muscle weakness (generalized): Secondary | ICD-10-CM | POA: Diagnosis not present

## 2022-03-13 DIAGNOSIS — M25521 Pain in right elbow: Secondary | ICD-10-CM | POA: Diagnosis not present

## 2022-03-13 DIAGNOSIS — M6281 Muscle weakness (generalized): Secondary | ICD-10-CM | POA: Diagnosis not present

## 2022-03-13 DIAGNOSIS — M25621 Stiffness of right elbow, not elsewhere classified: Secondary | ICD-10-CM | POA: Diagnosis not present

## 2022-03-14 DIAGNOSIS — J301 Allergic rhinitis due to pollen: Secondary | ICD-10-CM | POA: Diagnosis not present

## 2022-03-14 DIAGNOSIS — J3089 Other allergic rhinitis: Secondary | ICD-10-CM | POA: Diagnosis not present

## 2022-03-14 DIAGNOSIS — J3081 Allergic rhinitis due to animal (cat) (dog) hair and dander: Secondary | ICD-10-CM | POA: Diagnosis not present

## 2022-03-15 DIAGNOSIS — M25521 Pain in right elbow: Secondary | ICD-10-CM | POA: Diagnosis not present

## 2022-03-15 DIAGNOSIS — J301 Allergic rhinitis due to pollen: Secondary | ICD-10-CM | POA: Diagnosis not present

## 2022-03-15 DIAGNOSIS — M25621 Stiffness of right elbow, not elsewhere classified: Secondary | ICD-10-CM | POA: Diagnosis not present

## 2022-03-15 DIAGNOSIS — M6281 Muscle weakness (generalized): Secondary | ICD-10-CM | POA: Diagnosis not present

## 2022-03-16 DIAGNOSIS — J3081 Allergic rhinitis due to animal (cat) (dog) hair and dander: Secondary | ICD-10-CM | POA: Diagnosis not present

## 2022-03-16 DIAGNOSIS — J3089 Other allergic rhinitis: Secondary | ICD-10-CM | POA: Diagnosis not present

## 2022-03-20 DIAGNOSIS — M25621 Stiffness of right elbow, not elsewhere classified: Secondary | ICD-10-CM | POA: Diagnosis not present

## 2022-03-20 DIAGNOSIS — M6281 Muscle weakness (generalized): Secondary | ICD-10-CM | POA: Diagnosis not present

## 2022-03-20 DIAGNOSIS — J3089 Other allergic rhinitis: Secondary | ICD-10-CM | POA: Diagnosis not present

## 2022-03-20 DIAGNOSIS — J301 Allergic rhinitis due to pollen: Secondary | ICD-10-CM | POA: Diagnosis not present

## 2022-03-20 DIAGNOSIS — J3081 Allergic rhinitis due to animal (cat) (dog) hair and dander: Secondary | ICD-10-CM | POA: Diagnosis not present

## 2022-03-20 DIAGNOSIS — M25521 Pain in right elbow: Secondary | ICD-10-CM | POA: Diagnosis not present

## 2022-03-22 DIAGNOSIS — M25521 Pain in right elbow: Secondary | ICD-10-CM | POA: Diagnosis not present

## 2022-03-22 DIAGNOSIS — M25621 Stiffness of right elbow, not elsewhere classified: Secondary | ICD-10-CM | POA: Diagnosis not present

## 2022-03-22 DIAGNOSIS — M6281 Muscle weakness (generalized): Secondary | ICD-10-CM | POA: Diagnosis not present

## 2022-04-16 DIAGNOSIS — M6281 Muscle weakness (generalized): Secondary | ICD-10-CM | POA: Diagnosis not present

## 2022-04-16 DIAGNOSIS — M25621 Stiffness of right elbow, not elsewhere classified: Secondary | ICD-10-CM | POA: Diagnosis not present

## 2022-04-16 DIAGNOSIS — M25521 Pain in right elbow: Secondary | ICD-10-CM | POA: Diagnosis not present

## 2022-04-18 DIAGNOSIS — M25621 Stiffness of right elbow, not elsewhere classified: Secondary | ICD-10-CM | POA: Diagnosis not present

## 2022-04-18 DIAGNOSIS — M6281 Muscle weakness (generalized): Secondary | ICD-10-CM | POA: Diagnosis not present

## 2022-04-18 DIAGNOSIS — M25521 Pain in right elbow: Secondary | ICD-10-CM | POA: Diagnosis not present

## 2022-04-20 DIAGNOSIS — J301 Allergic rhinitis due to pollen: Secondary | ICD-10-CM | POA: Diagnosis not present

## 2022-04-20 DIAGNOSIS — J3089 Other allergic rhinitis: Secondary | ICD-10-CM | POA: Diagnosis not present

## 2022-04-20 DIAGNOSIS — J3081 Allergic rhinitis due to animal (cat) (dog) hair and dander: Secondary | ICD-10-CM | POA: Diagnosis not present

## 2022-04-23 DIAGNOSIS — M6281 Muscle weakness (generalized): Secondary | ICD-10-CM | POA: Diagnosis not present

## 2022-04-23 DIAGNOSIS — M25521 Pain in right elbow: Secondary | ICD-10-CM | POA: Diagnosis not present

## 2022-04-23 DIAGNOSIS — M25621 Stiffness of right elbow, not elsewhere classified: Secondary | ICD-10-CM | POA: Diagnosis not present

## 2022-04-25 DIAGNOSIS — M25521 Pain in right elbow: Secondary | ICD-10-CM | POA: Diagnosis not present

## 2022-04-25 DIAGNOSIS — M6281 Muscle weakness (generalized): Secondary | ICD-10-CM | POA: Diagnosis not present

## 2022-04-25 DIAGNOSIS — M25621 Stiffness of right elbow, not elsewhere classified: Secondary | ICD-10-CM | POA: Diagnosis not present

## 2022-04-26 DIAGNOSIS — J3081 Allergic rhinitis due to animal (cat) (dog) hair and dander: Secondary | ICD-10-CM | POA: Diagnosis not present

## 2022-04-26 DIAGNOSIS — J3089 Other allergic rhinitis: Secondary | ICD-10-CM | POA: Diagnosis not present

## 2022-04-26 DIAGNOSIS — J301 Allergic rhinitis due to pollen: Secondary | ICD-10-CM | POA: Diagnosis not present

## 2022-04-30 DIAGNOSIS — M25621 Stiffness of right elbow, not elsewhere classified: Secondary | ICD-10-CM | POA: Diagnosis not present

## 2022-04-30 DIAGNOSIS — M25521 Pain in right elbow: Secondary | ICD-10-CM | POA: Diagnosis not present

## 2022-04-30 DIAGNOSIS — M6281 Muscle weakness (generalized): Secondary | ICD-10-CM | POA: Diagnosis not present

## 2022-05-02 DIAGNOSIS — J301 Allergic rhinitis due to pollen: Secondary | ICD-10-CM | POA: Diagnosis not present

## 2022-05-02 DIAGNOSIS — J3081 Allergic rhinitis due to animal (cat) (dog) hair and dander: Secondary | ICD-10-CM | POA: Diagnosis not present

## 2022-05-02 DIAGNOSIS — J3089 Other allergic rhinitis: Secondary | ICD-10-CM | POA: Diagnosis not present

## 2022-05-09 DIAGNOSIS — J3089 Other allergic rhinitis: Secondary | ICD-10-CM | POA: Diagnosis not present

## 2022-05-09 DIAGNOSIS — J3081 Allergic rhinitis due to animal (cat) (dog) hair and dander: Secondary | ICD-10-CM | POA: Diagnosis not present

## 2022-05-09 DIAGNOSIS — J301 Allergic rhinitis due to pollen: Secondary | ICD-10-CM | POA: Diagnosis not present

## 2022-05-16 DIAGNOSIS — J301 Allergic rhinitis due to pollen: Secondary | ICD-10-CM | POA: Diagnosis not present

## 2022-05-16 DIAGNOSIS — J3081 Allergic rhinitis due to animal (cat) (dog) hair and dander: Secondary | ICD-10-CM | POA: Diagnosis not present

## 2022-05-16 DIAGNOSIS — J3089 Other allergic rhinitis: Secondary | ICD-10-CM | POA: Diagnosis not present

## 2022-05-23 DIAGNOSIS — J3089 Other allergic rhinitis: Secondary | ICD-10-CM | POA: Diagnosis not present

## 2022-05-23 DIAGNOSIS — J301 Allergic rhinitis due to pollen: Secondary | ICD-10-CM | POA: Diagnosis not present

## 2022-05-23 DIAGNOSIS — J3081 Allergic rhinitis due to animal (cat) (dog) hair and dander: Secondary | ICD-10-CM | POA: Diagnosis not present

## 2022-05-31 DIAGNOSIS — J3089 Other allergic rhinitis: Secondary | ICD-10-CM | POA: Diagnosis not present

## 2022-05-31 DIAGNOSIS — J301 Allergic rhinitis due to pollen: Secondary | ICD-10-CM | POA: Diagnosis not present

## 2022-05-31 DIAGNOSIS — J3081 Allergic rhinitis due to animal (cat) (dog) hair and dander: Secondary | ICD-10-CM | POA: Diagnosis not present

## 2022-06-07 DIAGNOSIS — J301 Allergic rhinitis due to pollen: Secondary | ICD-10-CM | POA: Diagnosis not present

## 2022-06-07 DIAGNOSIS — J3089 Other allergic rhinitis: Secondary | ICD-10-CM | POA: Diagnosis not present

## 2022-06-07 DIAGNOSIS — J3081 Allergic rhinitis due to animal (cat) (dog) hair and dander: Secondary | ICD-10-CM | POA: Diagnosis not present

## 2022-06-13 DIAGNOSIS — R3121 Asymptomatic microscopic hematuria: Secondary | ICD-10-CM | POA: Diagnosis not present

## 2022-06-13 DIAGNOSIS — R8289 Other abnormal findings on cytological and histological examination of urine: Secondary | ICD-10-CM | POA: Diagnosis not present

## 2022-06-21 DIAGNOSIS — J3081 Allergic rhinitis due to animal (cat) (dog) hair and dander: Secondary | ICD-10-CM | POA: Diagnosis not present

## 2022-06-21 DIAGNOSIS — J3089 Other allergic rhinitis: Secondary | ICD-10-CM | POA: Diagnosis not present

## 2022-06-21 DIAGNOSIS — J301 Allergic rhinitis due to pollen: Secondary | ICD-10-CM | POA: Diagnosis not present

## 2022-06-28 DIAGNOSIS — J3089 Other allergic rhinitis: Secondary | ICD-10-CM | POA: Diagnosis not present

## 2022-06-28 DIAGNOSIS — J3081 Allergic rhinitis due to animal (cat) (dog) hair and dander: Secondary | ICD-10-CM | POA: Diagnosis not present

## 2022-06-28 DIAGNOSIS — J301 Allergic rhinitis due to pollen: Secondary | ICD-10-CM | POA: Diagnosis not present

## 2022-07-04 DIAGNOSIS — J301 Allergic rhinitis due to pollen: Secondary | ICD-10-CM | POA: Diagnosis not present

## 2022-07-04 DIAGNOSIS — J3089 Other allergic rhinitis: Secondary | ICD-10-CM | POA: Diagnosis not present

## 2022-07-04 DIAGNOSIS — J3081 Allergic rhinitis due to animal (cat) (dog) hair and dander: Secondary | ICD-10-CM | POA: Diagnosis not present

## 2022-07-05 DIAGNOSIS — H04123 Dry eye syndrome of bilateral lacrimal glands: Secondary | ICD-10-CM | POA: Diagnosis not present

## 2022-07-05 DIAGNOSIS — H25813 Combined forms of age-related cataract, bilateral: Secondary | ICD-10-CM | POA: Diagnosis not present

## 2022-07-05 DIAGNOSIS — H524 Presbyopia: Secondary | ICD-10-CM | POA: Diagnosis not present

## 2022-07-19 DIAGNOSIS — J3081 Allergic rhinitis due to animal (cat) (dog) hair and dander: Secondary | ICD-10-CM | POA: Diagnosis not present

## 2022-07-19 DIAGNOSIS — J301 Allergic rhinitis due to pollen: Secondary | ICD-10-CM | POA: Diagnosis not present

## 2022-07-19 DIAGNOSIS — J3089 Other allergic rhinitis: Secondary | ICD-10-CM | POA: Diagnosis not present

## 2022-07-20 DIAGNOSIS — Z0000 Encounter for general adult medical examination without abnormal findings: Secondary | ICD-10-CM | POA: Diagnosis not present

## 2022-07-20 DIAGNOSIS — R3129 Other microscopic hematuria: Secondary | ICD-10-CM | POA: Diagnosis not present

## 2022-07-20 DIAGNOSIS — R946 Abnormal results of thyroid function studies: Secondary | ICD-10-CM | POA: Diagnosis not present

## 2022-07-20 DIAGNOSIS — R7309 Other abnormal glucose: Secondary | ICD-10-CM | POA: Diagnosis not present

## 2022-07-26 DIAGNOSIS — J301 Allergic rhinitis due to pollen: Secondary | ICD-10-CM | POA: Diagnosis not present

## 2022-07-26 DIAGNOSIS — J3089 Other allergic rhinitis: Secondary | ICD-10-CM | POA: Diagnosis not present

## 2022-07-26 DIAGNOSIS — J3081 Allergic rhinitis due to animal (cat) (dog) hair and dander: Secondary | ICD-10-CM | POA: Diagnosis not present

## 2022-07-27 DIAGNOSIS — Z9109 Other allergy status, other than to drugs and biological substances: Secondary | ICD-10-CM | POA: Diagnosis not present

## 2022-07-27 DIAGNOSIS — Z0000 Encounter for general adult medical examination without abnormal findings: Secondary | ICD-10-CM | POA: Diagnosis not present

## 2022-07-27 DIAGNOSIS — R3129 Other microscopic hematuria: Secondary | ICD-10-CM | POA: Diagnosis not present

## 2022-08-02 DIAGNOSIS — J301 Allergic rhinitis due to pollen: Secondary | ICD-10-CM | POA: Diagnosis not present

## 2022-08-02 DIAGNOSIS — J3089 Other allergic rhinitis: Secondary | ICD-10-CM | POA: Diagnosis not present

## 2022-08-02 DIAGNOSIS — J3081 Allergic rhinitis due to animal (cat) (dog) hair and dander: Secondary | ICD-10-CM | POA: Diagnosis not present

## 2022-08-09 DIAGNOSIS — J3089 Other allergic rhinitis: Secondary | ICD-10-CM | POA: Diagnosis not present

## 2022-08-09 DIAGNOSIS — J3081 Allergic rhinitis due to animal (cat) (dog) hair and dander: Secondary | ICD-10-CM | POA: Diagnosis not present

## 2022-08-09 DIAGNOSIS — J301 Allergic rhinitis due to pollen: Secondary | ICD-10-CM | POA: Diagnosis not present

## 2022-08-20 DIAGNOSIS — H25811 Combined forms of age-related cataract, right eye: Secondary | ICD-10-CM | POA: Diagnosis not present

## 2022-08-20 DIAGNOSIS — H35372 Puckering of macula, left eye: Secondary | ICD-10-CM | POA: Diagnosis not present

## 2022-08-20 DIAGNOSIS — H25813 Combined forms of age-related cataract, bilateral: Secondary | ICD-10-CM | POA: Diagnosis not present

## 2022-08-20 DIAGNOSIS — H53022 Refractive amblyopia, left eye: Secondary | ICD-10-CM | POA: Diagnosis not present

## 2022-08-20 DIAGNOSIS — H524 Presbyopia: Secondary | ICD-10-CM | POA: Diagnosis not present

## 2022-08-23 DIAGNOSIS — J3089 Other allergic rhinitis: Secondary | ICD-10-CM | POA: Diagnosis not present

## 2022-08-23 DIAGNOSIS — J301 Allergic rhinitis due to pollen: Secondary | ICD-10-CM | POA: Diagnosis not present

## 2022-08-23 DIAGNOSIS — J3081 Allergic rhinitis due to animal (cat) (dog) hair and dander: Secondary | ICD-10-CM | POA: Diagnosis not present

## 2022-08-30 DIAGNOSIS — J3081 Allergic rhinitis due to animal (cat) (dog) hair and dander: Secondary | ICD-10-CM | POA: Diagnosis not present

## 2022-08-30 DIAGNOSIS — J301 Allergic rhinitis due to pollen: Secondary | ICD-10-CM | POA: Diagnosis not present

## 2022-08-30 DIAGNOSIS — J3089 Other allergic rhinitis: Secondary | ICD-10-CM | POA: Diagnosis not present

## 2022-09-13 DIAGNOSIS — J3089 Other allergic rhinitis: Secondary | ICD-10-CM | POA: Diagnosis not present

## 2022-09-13 DIAGNOSIS — J301 Allergic rhinitis due to pollen: Secondary | ICD-10-CM | POA: Diagnosis not present

## 2022-09-13 DIAGNOSIS — J3081 Allergic rhinitis due to animal (cat) (dog) hair and dander: Secondary | ICD-10-CM | POA: Diagnosis not present

## 2022-09-18 DIAGNOSIS — H25811 Combined forms of age-related cataract, right eye: Secondary | ICD-10-CM | POA: Diagnosis not present

## 2022-09-18 DIAGNOSIS — H268 Other specified cataract: Secondary | ICD-10-CM | POA: Diagnosis not present

## 2022-09-24 ENCOUNTER — Other Ambulatory Visit: Payer: Self-pay | Admitting: Obstetrics and Gynecology

## 2022-09-24 DIAGNOSIS — Z1231 Encounter for screening mammogram for malignant neoplasm of breast: Secondary | ICD-10-CM

## 2022-09-27 DIAGNOSIS — J301 Allergic rhinitis due to pollen: Secondary | ICD-10-CM | POA: Diagnosis not present

## 2022-09-27 DIAGNOSIS — J3081 Allergic rhinitis due to animal (cat) (dog) hair and dander: Secondary | ICD-10-CM | POA: Diagnosis not present

## 2022-09-27 DIAGNOSIS — J3089 Other allergic rhinitis: Secondary | ICD-10-CM | POA: Diagnosis not present

## 2022-10-02 DIAGNOSIS — J3089 Other allergic rhinitis: Secondary | ICD-10-CM | POA: Diagnosis not present

## 2022-10-02 DIAGNOSIS — J3081 Allergic rhinitis due to animal (cat) (dog) hair and dander: Secondary | ICD-10-CM | POA: Diagnosis not present

## 2022-10-02 DIAGNOSIS — J301 Allergic rhinitis due to pollen: Secondary | ICD-10-CM | POA: Diagnosis not present

## 2022-10-11 DIAGNOSIS — J301 Allergic rhinitis due to pollen: Secondary | ICD-10-CM | POA: Diagnosis not present

## 2022-10-11 DIAGNOSIS — J3089 Other allergic rhinitis: Secondary | ICD-10-CM | POA: Diagnosis not present

## 2022-10-11 DIAGNOSIS — J3081 Allergic rhinitis due to animal (cat) (dog) hair and dander: Secondary | ICD-10-CM | POA: Diagnosis not present

## 2022-10-25 DIAGNOSIS — J301 Allergic rhinitis due to pollen: Secondary | ICD-10-CM | POA: Diagnosis not present

## 2022-10-25 DIAGNOSIS — J3081 Allergic rhinitis due to animal (cat) (dog) hair and dander: Secondary | ICD-10-CM | POA: Diagnosis not present

## 2022-10-25 DIAGNOSIS — J3089 Other allergic rhinitis: Secondary | ICD-10-CM | POA: Diagnosis not present

## 2022-11-01 DIAGNOSIS — J301 Allergic rhinitis due to pollen: Secondary | ICD-10-CM | POA: Diagnosis not present

## 2022-11-01 DIAGNOSIS — J3089 Other allergic rhinitis: Secondary | ICD-10-CM | POA: Diagnosis not present

## 2022-11-01 DIAGNOSIS — J3081 Allergic rhinitis due to animal (cat) (dog) hair and dander: Secondary | ICD-10-CM | POA: Diagnosis not present

## 2022-11-15 DIAGNOSIS — J301 Allergic rhinitis due to pollen: Secondary | ICD-10-CM | POA: Diagnosis not present

## 2022-11-15 DIAGNOSIS — J3081 Allergic rhinitis due to animal (cat) (dog) hair and dander: Secondary | ICD-10-CM | POA: Diagnosis not present

## 2022-11-15 DIAGNOSIS — J3089 Other allergic rhinitis: Secondary | ICD-10-CM | POA: Diagnosis not present

## 2022-11-30 DIAGNOSIS — J301 Allergic rhinitis due to pollen: Secondary | ICD-10-CM | POA: Diagnosis not present

## 2022-11-30 DIAGNOSIS — J3089 Other allergic rhinitis: Secondary | ICD-10-CM | POA: Diagnosis not present

## 2022-11-30 DIAGNOSIS — J3081 Allergic rhinitis due to animal (cat) (dog) hair and dander: Secondary | ICD-10-CM | POA: Diagnosis not present

## 2022-12-04 DIAGNOSIS — Z124 Encounter for screening for malignant neoplasm of cervix: Secondary | ICD-10-CM | POA: Diagnosis not present

## 2022-12-04 DIAGNOSIS — Z01419 Encounter for gynecological examination (general) (routine) without abnormal findings: Secondary | ICD-10-CM | POA: Diagnosis not present

## 2022-12-12 DIAGNOSIS — Z8051 Family history of malignant neoplasm of kidney: Secondary | ICD-10-CM | POA: Diagnosis not present

## 2022-12-12 DIAGNOSIS — R3121 Asymptomatic microscopic hematuria: Secondary | ICD-10-CM | POA: Diagnosis not present

## 2022-12-20 DIAGNOSIS — J3089 Other allergic rhinitis: Secondary | ICD-10-CM | POA: Diagnosis not present

## 2022-12-20 DIAGNOSIS — J301 Allergic rhinitis due to pollen: Secondary | ICD-10-CM | POA: Diagnosis not present

## 2022-12-20 DIAGNOSIS — J3081 Allergic rhinitis due to animal (cat) (dog) hair and dander: Secondary | ICD-10-CM | POA: Diagnosis not present

## 2022-12-31 ENCOUNTER — Ambulatory Visit
Admission: RE | Admit: 2022-12-31 | Discharge: 2022-12-31 | Disposition: A | Discharge status: Home / Self Care | Payer: BC Managed Care – PPO | Source: Ambulatory Visit | Attending: Obstetrics and Gynecology | Admitting: Obstetrics and Gynecology

## 2022-12-31 DIAGNOSIS — Z1231 Encounter for screening mammogram for malignant neoplasm of breast: Secondary | ICD-10-CM | POA: Diagnosis not present

## 2023-01-07 DIAGNOSIS — J301 Allergic rhinitis due to pollen: Secondary | ICD-10-CM | POA: Diagnosis not present

## 2023-01-07 DIAGNOSIS — J3081 Allergic rhinitis due to animal (cat) (dog) hair and dander: Secondary | ICD-10-CM | POA: Diagnosis not present

## 2023-01-07 DIAGNOSIS — J3089 Other allergic rhinitis: Secondary | ICD-10-CM | POA: Diagnosis not present

## 2023-01-08 ENCOUNTER — Other Ambulatory Visit: Payer: Self-pay | Admitting: Obstetrics and Gynecology

## 2023-01-08 DIAGNOSIS — R928 Other abnormal and inconclusive findings on diagnostic imaging of breast: Secondary | ICD-10-CM

## 2023-01-21 ENCOUNTER — Ambulatory Visit: Payer: BC Managed Care – PPO

## 2023-01-21 ENCOUNTER — Ambulatory Visit
Admission: RE | Admit: 2023-01-21 | Discharge: 2023-01-21 | Disposition: A | Discharge status: Home / Self Care | Payer: BC Managed Care – PPO | Source: Ambulatory Visit | Attending: Obstetrics and Gynecology | Admitting: Obstetrics and Gynecology

## 2023-01-21 DIAGNOSIS — R928 Other abnormal and inconclusive findings on diagnostic imaging of breast: Secondary | ICD-10-CM

## 2023-01-21 DIAGNOSIS — N6310 Unspecified lump in the right breast, unspecified quadrant: Secondary | ICD-10-CM | POA: Diagnosis not present

## 2023-03-07 DIAGNOSIS — L309 Dermatitis, unspecified: Secondary | ICD-10-CM | POA: Diagnosis not present

## 2023-04-04 LAB — COLOGUARD: COLOGUARD: NEGATIVE

## 2023-04-26 DIAGNOSIS — H04123 Dry eye syndrome of bilateral lacrimal glands: Secondary | ICD-10-CM | POA: Diagnosis not present

## 2023-04-26 DIAGNOSIS — H25812 Combined forms of age-related cataract, left eye: Secondary | ICD-10-CM | POA: Diagnosis not present

## 2023-04-26 DIAGNOSIS — Z961 Presence of intraocular lens: Secondary | ICD-10-CM | POA: Diagnosis not present

## 2023-04-26 DIAGNOSIS — H53143 Visual discomfort, bilateral: Secondary | ICD-10-CM | POA: Diagnosis not present

## 2023-05-09 DIAGNOSIS — J301 Allergic rhinitis due to pollen: Secondary | ICD-10-CM | POA: Diagnosis not present

## 2023-05-10 DIAGNOSIS — J3089 Other allergic rhinitis: Secondary | ICD-10-CM | POA: Diagnosis not present

## 2023-05-10 DIAGNOSIS — J3081 Allergic rhinitis due to animal (cat) (dog) hair and dander: Secondary | ICD-10-CM | POA: Diagnosis not present

## 2023-07-22 DIAGNOSIS — R7309 Other abnormal glucose: Secondary | ICD-10-CM | POA: Diagnosis not present

## 2023-07-22 DIAGNOSIS — R946 Abnormal results of thyroid function studies: Secondary | ICD-10-CM | POA: Diagnosis not present

## 2023-07-22 DIAGNOSIS — Z13220 Encounter for screening for lipoid disorders: Secondary | ICD-10-CM | POA: Diagnosis not present

## 2023-07-22 DIAGNOSIS — Z79899 Other long term (current) drug therapy: Secondary | ICD-10-CM | POA: Diagnosis not present

## 2023-07-29 DIAGNOSIS — Z0000 Encounter for general adult medical examination without abnormal findings: Secondary | ICD-10-CM | POA: Diagnosis not present

## 2023-07-29 DIAGNOSIS — Z9109 Other allergy status, other than to drugs and biological substances: Secondary | ICD-10-CM | POA: Diagnosis not present

## 2023-07-29 DIAGNOSIS — R3129 Other microscopic hematuria: Secondary | ICD-10-CM | POA: Diagnosis not present

## 2023-07-31 DIAGNOSIS — J301 Allergic rhinitis due to pollen: Secondary | ICD-10-CM | POA: Diagnosis not present

## 2023-07-31 DIAGNOSIS — J3081 Allergic rhinitis due to animal (cat) (dog) hair and dander: Secondary | ICD-10-CM | POA: Diagnosis not present

## 2023-07-31 DIAGNOSIS — J3089 Other allergic rhinitis: Secondary | ICD-10-CM | POA: Diagnosis not present

## 2023-08-09 DIAGNOSIS — J3081 Allergic rhinitis due to animal (cat) (dog) hair and dander: Secondary | ICD-10-CM | POA: Diagnosis not present

## 2023-08-09 DIAGNOSIS — J3089 Other allergic rhinitis: Secondary | ICD-10-CM | POA: Diagnosis not present

## 2023-08-09 DIAGNOSIS — J301 Allergic rhinitis due to pollen: Secondary | ICD-10-CM | POA: Diagnosis not present

## 2023-08-21 DIAGNOSIS — J3089 Other allergic rhinitis: Secondary | ICD-10-CM | POA: Diagnosis not present

## 2023-08-21 DIAGNOSIS — J301 Allergic rhinitis due to pollen: Secondary | ICD-10-CM | POA: Diagnosis not present

## 2023-08-21 DIAGNOSIS — J3081 Allergic rhinitis due to animal (cat) (dog) hair and dander: Secondary | ICD-10-CM | POA: Diagnosis not present

## 2023-08-28 DIAGNOSIS — J3089 Other allergic rhinitis: Secondary | ICD-10-CM | POA: Diagnosis not present

## 2023-08-28 DIAGNOSIS — J301 Allergic rhinitis due to pollen: Secondary | ICD-10-CM | POA: Diagnosis not present

## 2023-08-28 DIAGNOSIS — J3081 Allergic rhinitis due to animal (cat) (dog) hair and dander: Secondary | ICD-10-CM | POA: Diagnosis not present

## 2023-09-04 DIAGNOSIS — J301 Allergic rhinitis due to pollen: Secondary | ICD-10-CM | POA: Diagnosis not present

## 2023-09-04 DIAGNOSIS — J3081 Allergic rhinitis due to animal (cat) (dog) hair and dander: Secondary | ICD-10-CM | POA: Diagnosis not present

## 2023-09-04 DIAGNOSIS — J3089 Other allergic rhinitis: Secondary | ICD-10-CM | POA: Diagnosis not present

## 2023-09-13 DIAGNOSIS — J3089 Other allergic rhinitis: Secondary | ICD-10-CM | POA: Diagnosis not present

## 2023-09-13 DIAGNOSIS — J301 Allergic rhinitis due to pollen: Secondary | ICD-10-CM | POA: Diagnosis not present

## 2023-09-13 DIAGNOSIS — J3081 Allergic rhinitis due to animal (cat) (dog) hair and dander: Secondary | ICD-10-CM | POA: Diagnosis not present

## 2023-09-27 DIAGNOSIS — J3081 Allergic rhinitis due to animal (cat) (dog) hair and dander: Secondary | ICD-10-CM | POA: Diagnosis not present

## 2023-09-27 DIAGNOSIS — J3089 Other allergic rhinitis: Secondary | ICD-10-CM | POA: Diagnosis not present

## 2023-09-27 DIAGNOSIS — J301 Allergic rhinitis due to pollen: Secondary | ICD-10-CM | POA: Diagnosis not present

## 2023-10-01 DIAGNOSIS — J3089 Other allergic rhinitis: Secondary | ICD-10-CM | POA: Diagnosis not present

## 2023-10-01 DIAGNOSIS — J301 Allergic rhinitis due to pollen: Secondary | ICD-10-CM | POA: Diagnosis not present

## 2023-10-01 DIAGNOSIS — J3081 Allergic rhinitis due to animal (cat) (dog) hair and dander: Secondary | ICD-10-CM | POA: Diagnosis not present

## 2023-10-04 DIAGNOSIS — J3089 Other allergic rhinitis: Secondary | ICD-10-CM | POA: Diagnosis not present

## 2023-10-04 DIAGNOSIS — J3081 Allergic rhinitis due to animal (cat) (dog) hair and dander: Secondary | ICD-10-CM | POA: Diagnosis not present

## 2023-10-04 DIAGNOSIS — J301 Allergic rhinitis due to pollen: Secondary | ICD-10-CM | POA: Diagnosis not present

## 2023-10-11 DIAGNOSIS — J3089 Other allergic rhinitis: Secondary | ICD-10-CM | POA: Diagnosis not present

## 2023-10-11 DIAGNOSIS — J3081 Allergic rhinitis due to animal (cat) (dog) hair and dander: Secondary | ICD-10-CM | POA: Diagnosis not present

## 2023-10-11 DIAGNOSIS — J301 Allergic rhinitis due to pollen: Secondary | ICD-10-CM | POA: Diagnosis not present

## 2023-10-16 DIAGNOSIS — J3081 Allergic rhinitis due to animal (cat) (dog) hair and dander: Secondary | ICD-10-CM | POA: Diagnosis not present

## 2023-10-16 DIAGNOSIS — J301 Allergic rhinitis due to pollen: Secondary | ICD-10-CM | POA: Diagnosis not present

## 2023-10-16 DIAGNOSIS — J3089 Other allergic rhinitis: Secondary | ICD-10-CM | POA: Diagnosis not present

## 2023-11-12 DIAGNOSIS — B360 Pityriasis versicolor: Secondary | ICD-10-CM | POA: Diagnosis not present

## 2023-12-09 ENCOUNTER — Other Ambulatory Visit: Payer: Self-pay | Admitting: Obstetrics and Gynecology

## 2023-12-09 DIAGNOSIS — Z1231 Encounter for screening mammogram for malignant neoplasm of breast: Secondary | ICD-10-CM

## 2023-12-10 DIAGNOSIS — Z01419 Encounter for gynecological examination (general) (routine) without abnormal findings: Secondary | ICD-10-CM | POA: Diagnosis not present

## 2023-12-11 DIAGNOSIS — R3121 Asymptomatic microscopic hematuria: Secondary | ICD-10-CM | POA: Diagnosis not present

## 2023-12-16 DIAGNOSIS — J3081 Allergic rhinitis due to animal (cat) (dog) hair and dander: Secondary | ICD-10-CM | POA: Diagnosis not present

## 2023-12-16 DIAGNOSIS — J301 Allergic rhinitis due to pollen: Secondary | ICD-10-CM | POA: Diagnosis not present

## 2023-12-16 DIAGNOSIS — J3089 Other allergic rhinitis: Secondary | ICD-10-CM | POA: Diagnosis not present

## 2023-12-20 DIAGNOSIS — J301 Allergic rhinitis due to pollen: Secondary | ICD-10-CM | POA: Diagnosis not present

## 2023-12-20 DIAGNOSIS — J3081 Allergic rhinitis due to animal (cat) (dog) hair and dander: Secondary | ICD-10-CM | POA: Diagnosis not present

## 2023-12-20 DIAGNOSIS — J3089 Other allergic rhinitis: Secondary | ICD-10-CM | POA: Diagnosis not present

## 2023-12-26 DIAGNOSIS — J3089 Other allergic rhinitis: Secondary | ICD-10-CM | POA: Diagnosis not present

## 2023-12-26 DIAGNOSIS — J301 Allergic rhinitis due to pollen: Secondary | ICD-10-CM | POA: Diagnosis not present

## 2023-12-26 DIAGNOSIS — J3081 Allergic rhinitis due to animal (cat) (dog) hair and dander: Secondary | ICD-10-CM | POA: Diagnosis not present

## 2024-01-07 ENCOUNTER — Ambulatory Visit
Admission: RE | Admit: 2024-01-07 | Discharge: 2024-01-07 | Disposition: A | Discharge status: Home / Self Care | Source: Ambulatory Visit | Attending: Obstetrics and Gynecology | Admitting: Obstetrics and Gynecology

## 2024-01-07 DIAGNOSIS — Z1231 Encounter for screening mammogram for malignant neoplasm of breast: Secondary | ICD-10-CM | POA: Diagnosis not present
